# Patient Record
Sex: Male | Born: 1935 | Race: White | Hispanic: No | Marital: Married | Smoking: Former smoker
Health system: Southern US, Community
[De-identification: ages and names within clinical notes are randomized; demographics above are authoritative.]

## PROBLEM LIST (undated history)

## (undated) DIAGNOSIS — K279 Peptic ulcer, site unspecified, unspecified as acute or chronic, without hemorrhage or perforation: Secondary | ICD-10-CM

## (undated) DIAGNOSIS — G629 Polyneuropathy, unspecified: Secondary | ICD-10-CM

## (undated) DIAGNOSIS — B029 Zoster without complications: Secondary | ICD-10-CM

## (undated) DIAGNOSIS — I10 Essential (primary) hypertension: Secondary | ICD-10-CM

## (undated) DIAGNOSIS — K5790 Diverticulosis of intestine, part unspecified, without perforation or abscess without bleeding: Secondary | ICD-10-CM

## (undated) DIAGNOSIS — E119 Type 2 diabetes mellitus without complications: Secondary | ICD-10-CM

## (undated) DIAGNOSIS — K219 Gastro-esophageal reflux disease without esophagitis: Secondary | ICD-10-CM

## (undated) DIAGNOSIS — E7800 Pure hypercholesterolemia, unspecified: Secondary | ICD-10-CM

## (undated) HISTORY — DX: Diverticulosis of intestine, part unspecified, without perforation or abscess without bleeding: K57.90

## (undated) HISTORY — DX: Type 2 diabetes mellitus without complications: E11.9

## (undated) HISTORY — DX: Peptic ulcer, site unspecified, unspecified as acute or chronic, without hemorrhage or perforation: K27.9

## (undated) HISTORY — DX: Zoster without complications: B02.9

## (undated) HISTORY — DX: Polyneuropathy, unspecified: G62.9

## (undated) HISTORY — DX: Essential (primary) hypertension: I10

## (undated) HISTORY — DX: Gastro-esophageal reflux disease without esophagitis: K21.9

## (undated) HISTORY — DX: Pure hypercholesterolemia, unspecified: E78.00

---

## 2001-03-11 ENCOUNTER — Ambulatory Visit (HOSPITAL_BASED_OUTPATIENT_CLINIC_OR_DEPARTMENT_OTHER): Admission: RE | Admit: 2001-03-11 | Discharge: 2001-03-11 | Payer: Self-pay | Admitting: Geriatric Medicine

## 2002-09-26 ENCOUNTER — Encounter: Admission: RE | Admit: 2002-09-26 | Discharge: 2002-12-25 | Payer: Self-pay | Admitting: Geriatric Medicine

## 2003-12-31 ENCOUNTER — Ambulatory Visit (HOSPITAL_COMMUNITY): Admission: RE | Admit: 2003-12-31 | Discharge: 2003-12-31 | Payer: Self-pay | Admitting: Gastroenterology

## 2004-03-18 ENCOUNTER — Emergency Department (HOSPITAL_COMMUNITY): Admission: EM | Admit: 2004-03-18 | Discharge: 2004-03-18 | Payer: Self-pay | Admitting: Emergency Medicine

## 2011-11-11 DIAGNOSIS — E1149 Type 2 diabetes mellitus with other diabetic neurological complication: Secondary | ICD-10-CM | POA: Diagnosis not present

## 2011-11-11 DIAGNOSIS — E1142 Type 2 diabetes mellitus with diabetic polyneuropathy: Secondary | ICD-10-CM | POA: Diagnosis not present

## 2011-11-11 DIAGNOSIS — E7800 Pure hypercholesterolemia, unspecified: Secondary | ICD-10-CM | POA: Diagnosis not present

## 2011-11-11 DIAGNOSIS — I10 Essential (primary) hypertension: Secondary | ICD-10-CM | POA: Diagnosis not present

## 2011-11-11 DIAGNOSIS — Z79899 Other long term (current) drug therapy: Secondary | ICD-10-CM | POA: Diagnosis not present

## 2012-01-20 DIAGNOSIS — Z961 Presence of intraocular lens: Secondary | ICD-10-CM | POA: Diagnosis not present

## 2012-01-20 DIAGNOSIS — D3130 Benign neoplasm of unspecified choroid: Secondary | ICD-10-CM | POA: Diagnosis not present

## 2012-01-20 DIAGNOSIS — E119 Type 2 diabetes mellitus without complications: Secondary | ICD-10-CM | POA: Diagnosis not present

## 2012-01-20 DIAGNOSIS — H04129 Dry eye syndrome of unspecified lacrimal gland: Secondary | ICD-10-CM | POA: Diagnosis not present

## 2012-01-20 DIAGNOSIS — B029 Zoster without complications: Secondary | ICD-10-CM | POA: Diagnosis not present

## 2012-02-01 DIAGNOSIS — D3130 Benign neoplasm of unspecified choroid: Secondary | ICD-10-CM | POA: Diagnosis not present

## 2012-02-01 DIAGNOSIS — B0239 Other herpes zoster eye disease: Secondary | ICD-10-CM | POA: Diagnosis not present

## 2012-02-01 DIAGNOSIS — E119 Type 2 diabetes mellitus without complications: Secondary | ICD-10-CM | POA: Diagnosis not present

## 2012-02-01 DIAGNOSIS — H04129 Dry eye syndrome of unspecified lacrimal gland: Secondary | ICD-10-CM | POA: Diagnosis not present

## 2012-02-06 DIAGNOSIS — I499 Cardiac arrhythmia, unspecified: Secondary | ICD-10-CM | POA: Diagnosis not present

## 2012-02-06 DIAGNOSIS — B0229 Other postherpetic nervous system involvement: Secondary | ICD-10-CM | POA: Diagnosis not present

## 2012-02-08 DIAGNOSIS — B0239 Other herpes zoster eye disease: Secondary | ICD-10-CM | POA: Diagnosis not present

## 2012-02-14 DIAGNOSIS — B0229 Other postherpetic nervous system involvement: Secondary | ICD-10-CM | POA: Diagnosis not present

## 2012-02-14 DIAGNOSIS — R634 Abnormal weight loss: Secondary | ICD-10-CM | POA: Diagnosis not present

## 2012-02-17 DIAGNOSIS — B0233 Zoster keratitis: Secondary | ICD-10-CM | POA: Diagnosis not present

## 2012-03-01 DIAGNOSIS — B0229 Other postherpetic nervous system involvement: Secondary | ICD-10-CM | POA: Diagnosis not present

## 2012-03-12 DIAGNOSIS — B0229 Other postherpetic nervous system involvement: Secondary | ICD-10-CM | POA: Diagnosis not present

## 2012-04-04 DIAGNOSIS — B0222 Postherpetic trigeminal neuralgia: Secondary | ICD-10-CM | POA: Insufficient documentation

## 2012-05-25 DIAGNOSIS — E1149 Type 2 diabetes mellitus with other diabetic neurological complication: Secondary | ICD-10-CM | POA: Diagnosis not present

## 2012-05-25 DIAGNOSIS — Z0000 Encounter for general adult medical examination without abnormal findings: Secondary | ICD-10-CM | POA: Diagnosis not present

## 2012-05-25 DIAGNOSIS — E1142 Type 2 diabetes mellitus with diabetic polyneuropathy: Secondary | ICD-10-CM | POA: Diagnosis not present

## 2012-05-25 DIAGNOSIS — Z79899 Other long term (current) drug therapy: Secondary | ICD-10-CM | POA: Diagnosis not present

## 2012-05-25 DIAGNOSIS — E7800 Pure hypercholesterolemia, unspecified: Secondary | ICD-10-CM | POA: Diagnosis not present

## 2012-05-25 DIAGNOSIS — Z23 Encounter for immunization: Secondary | ICD-10-CM | POA: Diagnosis not present

## 2012-05-25 DIAGNOSIS — Z1331 Encounter for screening for depression: Secondary | ICD-10-CM | POA: Diagnosis not present

## 2012-06-13 DIAGNOSIS — E119 Type 2 diabetes mellitus without complications: Secondary | ICD-10-CM | POA: Diagnosis not present

## 2012-06-13 DIAGNOSIS — B0233 Zoster keratitis: Secondary | ICD-10-CM | POA: Diagnosis not present

## 2012-06-13 DIAGNOSIS — H52209 Unspecified astigmatism, unspecified eye: Secondary | ICD-10-CM | POA: Diagnosis not present

## 2012-06-13 DIAGNOSIS — D3130 Benign neoplasm of unspecified choroid: Secondary | ICD-10-CM | POA: Diagnosis not present

## 2012-07-04 DIAGNOSIS — Z961 Presence of intraocular lens: Secondary | ICD-10-CM | POA: Diagnosis not present

## 2012-07-04 DIAGNOSIS — H04129 Dry eye syndrome of unspecified lacrimal gland: Secondary | ICD-10-CM | POA: Diagnosis not present

## 2012-07-04 DIAGNOSIS — D3130 Benign neoplasm of unspecified choroid: Secondary | ICD-10-CM | POA: Diagnosis not present

## 2012-10-10 DIAGNOSIS — H6120 Impacted cerumen, unspecified ear: Secondary | ICD-10-CM | POA: Diagnosis not present

## 2012-11-23 DIAGNOSIS — I10 Essential (primary) hypertension: Secondary | ICD-10-CM | POA: Diagnosis not present

## 2012-11-23 DIAGNOSIS — E7800 Pure hypercholesterolemia, unspecified: Secondary | ICD-10-CM | POA: Diagnosis not present

## 2012-11-23 DIAGNOSIS — Z79899 Other long term (current) drug therapy: Secondary | ICD-10-CM | POA: Diagnosis not present

## 2012-11-23 DIAGNOSIS — R252 Cramp and spasm: Secondary | ICD-10-CM | POA: Diagnosis not present

## 2012-11-23 DIAGNOSIS — E119 Type 2 diabetes mellitus without complications: Secondary | ICD-10-CM | POA: Diagnosis not present

## 2013-04-04 DIAGNOSIS — Z961 Presence of intraocular lens: Secondary | ICD-10-CM | POA: Diagnosis not present

## 2013-04-04 DIAGNOSIS — B0230 Zoster ocular disease, unspecified: Secondary | ICD-10-CM | POA: Diagnosis not present

## 2013-04-04 DIAGNOSIS — H04129 Dry eye syndrome of unspecified lacrimal gland: Secondary | ICD-10-CM | POA: Diagnosis not present

## 2013-04-04 DIAGNOSIS — H01029 Squamous blepharitis unspecified eye, unspecified eyelid: Secondary | ICD-10-CM | POA: Diagnosis not present

## 2013-04-04 DIAGNOSIS — H1045 Other chronic allergic conjunctivitis: Secondary | ICD-10-CM | POA: Diagnosis not present

## 2013-04-05 DIAGNOSIS — H04129 Dry eye syndrome of unspecified lacrimal gland: Secondary | ICD-10-CM | POA: Diagnosis not present

## 2013-04-05 DIAGNOSIS — D3130 Benign neoplasm of unspecified choroid: Secondary | ICD-10-CM | POA: Diagnosis not present

## 2013-04-05 DIAGNOSIS — Z961 Presence of intraocular lens: Secondary | ICD-10-CM | POA: Diagnosis not present

## 2013-05-02 DIAGNOSIS — Z23 Encounter for immunization: Secondary | ICD-10-CM | POA: Diagnosis not present

## 2013-05-29 DIAGNOSIS — I10 Essential (primary) hypertension: Secondary | ICD-10-CM | POA: Diagnosis not present

## 2013-05-29 DIAGNOSIS — M810 Age-related osteoporosis without current pathological fracture: Secondary | ICD-10-CM | POA: Diagnosis not present

## 2013-05-29 DIAGNOSIS — E1149 Type 2 diabetes mellitus with other diabetic neurological complication: Secondary | ICD-10-CM | POA: Diagnosis not present

## 2013-05-29 DIAGNOSIS — Z79899 Other long term (current) drug therapy: Secondary | ICD-10-CM | POA: Diagnosis not present

## 2013-05-29 DIAGNOSIS — E1142 Type 2 diabetes mellitus with diabetic polyneuropathy: Secondary | ICD-10-CM | POA: Diagnosis not present

## 2013-05-29 DIAGNOSIS — Z0000 Encounter for general adult medical examination without abnormal findings: Secondary | ICD-10-CM | POA: Diagnosis not present

## 2013-06-17 DIAGNOSIS — J329 Chronic sinusitis, unspecified: Secondary | ICD-10-CM | POA: Diagnosis not present

## 2013-06-17 DIAGNOSIS — R000 Tachycardia, unspecified: Secondary | ICD-10-CM | POA: Diagnosis not present

## 2013-07-12 DIAGNOSIS — D1801 Hemangioma of skin and subcutaneous tissue: Secondary | ICD-10-CM | POA: Diagnosis not present

## 2013-07-12 DIAGNOSIS — D2320 Other benign neoplasm of skin of unspecified ear and external auricular canal: Secondary | ICD-10-CM | POA: Diagnosis not present

## 2013-07-12 DIAGNOSIS — L821 Other seborrheic keratosis: Secondary | ICD-10-CM | POA: Diagnosis not present

## 2013-07-12 DIAGNOSIS — D235 Other benign neoplasm of skin of trunk: Secondary | ICD-10-CM | POA: Diagnosis not present

## 2013-07-12 DIAGNOSIS — D239 Other benign neoplasm of skin, unspecified: Secondary | ICD-10-CM | POA: Diagnosis not present

## 2013-07-12 DIAGNOSIS — L570 Actinic keratosis: Secondary | ICD-10-CM | POA: Diagnosis not present

## 2013-08-26 DIAGNOSIS — J40 Bronchitis, not specified as acute or chronic: Secondary | ICD-10-CM | POA: Diagnosis not present

## 2013-11-20 DIAGNOSIS — I10 Essential (primary) hypertension: Secondary | ICD-10-CM | POA: Diagnosis not present

## 2013-11-20 DIAGNOSIS — E1142 Type 2 diabetes mellitus with diabetic polyneuropathy: Secondary | ICD-10-CM | POA: Diagnosis not present

## 2013-11-20 DIAGNOSIS — Z23 Encounter for immunization: Secondary | ICD-10-CM | POA: Diagnosis not present

## 2013-11-20 DIAGNOSIS — B0229 Other postherpetic nervous system involvement: Secondary | ICD-10-CM | POA: Diagnosis not present

## 2013-11-20 DIAGNOSIS — E7800 Pure hypercholesterolemia, unspecified: Secondary | ICD-10-CM | POA: Diagnosis not present

## 2013-11-20 DIAGNOSIS — Z79899 Other long term (current) drug therapy: Secondary | ICD-10-CM | POA: Diagnosis not present

## 2013-11-20 DIAGNOSIS — E1149 Type 2 diabetes mellitus with other diabetic neurological complication: Secondary | ICD-10-CM | POA: Diagnosis not present

## 2014-01-07 DIAGNOSIS — J309 Allergic rhinitis, unspecified: Secondary | ICD-10-CM | POA: Diagnosis not present

## 2014-01-07 DIAGNOSIS — K219 Gastro-esophageal reflux disease without esophagitis: Secondary | ICD-10-CM | POA: Diagnosis not present

## 2014-01-07 DIAGNOSIS — J410 Simple chronic bronchitis: Secondary | ICD-10-CM | POA: Diagnosis not present

## 2014-04-30 DIAGNOSIS — H04129 Dry eye syndrome of unspecified lacrimal gland: Secondary | ICD-10-CM | POA: Diagnosis not present

## 2014-04-30 DIAGNOSIS — D3130 Benign neoplasm of unspecified choroid: Secondary | ICD-10-CM | POA: Diagnosis not present

## 2014-04-30 DIAGNOSIS — Z961 Presence of intraocular lens: Secondary | ICD-10-CM | POA: Diagnosis not present

## 2014-04-30 DIAGNOSIS — E119 Type 2 diabetes mellitus without complications: Secondary | ICD-10-CM | POA: Diagnosis not present

## 2014-04-30 DIAGNOSIS — B0230 Zoster ocular disease, unspecified: Secondary | ICD-10-CM | POA: Diagnosis not present

## 2014-05-28 DIAGNOSIS — Z23 Encounter for immunization: Secondary | ICD-10-CM | POA: Diagnosis not present

## 2014-06-11 DIAGNOSIS — Z0000 Encounter for general adult medical examination without abnormal findings: Secondary | ICD-10-CM | POA: Diagnosis not present

## 2014-06-11 DIAGNOSIS — Z1389 Encounter for screening for other disorder: Secondary | ICD-10-CM | POA: Diagnosis not present

## 2014-06-11 DIAGNOSIS — I10 Essential (primary) hypertension: Secondary | ICD-10-CM | POA: Diagnosis not present

## 2014-06-11 DIAGNOSIS — Z79899 Other long term (current) drug therapy: Secondary | ICD-10-CM | POA: Diagnosis not present

## 2014-06-11 DIAGNOSIS — K219 Gastro-esophageal reflux disease without esophagitis: Secondary | ICD-10-CM | POA: Diagnosis not present

## 2014-06-11 DIAGNOSIS — E1140 Type 2 diabetes mellitus with diabetic neuropathy, unspecified: Secondary | ICD-10-CM | POA: Diagnosis not present

## 2014-06-11 DIAGNOSIS — E780 Pure hypercholesterolemia: Secondary | ICD-10-CM | POA: Diagnosis not present

## 2014-07-11 ENCOUNTER — Encounter (INDEPENDENT_AMBULATORY_CARE_PROVIDER_SITE_OTHER): Payer: Self-pay

## 2014-07-11 ENCOUNTER — Ambulatory Visit
Admission: RE | Admit: 2014-07-11 | Discharge: 2014-07-11 | Disposition: A | Discharge status: Home / Self Care | Payer: Medicare Other | Source: Ambulatory Visit | Attending: Geriatric Medicine | Admitting: Geriatric Medicine

## 2014-07-11 ENCOUNTER — Other Ambulatory Visit: Payer: Self-pay | Admitting: Geriatric Medicine

## 2014-07-11 DIAGNOSIS — R05 Cough: Secondary | ICD-10-CM | POA: Diagnosis not present

## 2014-07-11 DIAGNOSIS — R2681 Unsteadiness on feet: Secondary | ICD-10-CM | POA: Diagnosis not present

## 2014-07-11 DIAGNOSIS — R059 Cough, unspecified: Secondary | ICD-10-CM

## 2014-07-11 DIAGNOSIS — J309 Allergic rhinitis, unspecified: Secondary | ICD-10-CM | POA: Diagnosis not present

## 2014-07-11 DIAGNOSIS — R0989 Other specified symptoms and signs involving the circulatory and respiratory systems: Secondary | ICD-10-CM | POA: Diagnosis not present

## 2014-07-11 DIAGNOSIS — I517 Cardiomegaly: Secondary | ICD-10-CM | POA: Diagnosis not present

## 2014-08-28 ENCOUNTER — Ambulatory Visit (INDEPENDENT_AMBULATORY_CARE_PROVIDER_SITE_OTHER): Payer: Medicare Other | Admitting: Internal Medicine

## 2014-08-28 ENCOUNTER — Encounter: Payer: Self-pay | Admitting: Internal Medicine

## 2014-08-28 VITALS — BP 122/60 | HR 87 | Ht 71.0 in | Wt 163.0 lb

## 2014-08-28 DIAGNOSIS — J310 Chronic rhinitis: Secondary | ICD-10-CM | POA: Insufficient documentation

## 2014-08-28 DIAGNOSIS — J8410 Pulmonary fibrosis, unspecified: Secondary | ICD-10-CM | POA: Insufficient documentation

## 2014-08-28 DIAGNOSIS — J84112 Idiopathic pulmonary fibrosis: Secondary | ICD-10-CM

## 2014-08-28 DIAGNOSIS — J849 Interstitial pulmonary disease, unspecified: Secondary | ICD-10-CM

## 2014-08-28 MED ORDER — AZELASTINE-FLUTICASONE 137-50 MCG/ACT NA SUSP: 2.0000 | NASAL | Status: DC

## 2014-08-28 NOTE — Patient Instructions (Signed)
Order- schedule CT chest  noncontrast    Dx interstitial lung disease  Order -schedule PFT and 6 MWT  Sample Dymista nasal spray   1-2 puffs each nostril once daily at bedtime  Please call as needed

## 2014-08-28 NOTE — Progress Notes (Signed)
08/28/14-  81 yo M former smoker referred courtesy of Dr Felipa Eth for interstitial lung disease CXR 12/11/115- images reviewed with him IMPRESSION: Borderline cardiomegaly. Peripheral and bilateral basilar reticular interstitial prominence suspicious for chronic interstitial lung disease or fibrotic changes. No definite superimposed infiltrate or pulmonary edema. Electronically Signed  By: Lahoma Crocker M.D.  On: 07/11/2014 12:00 In the 1990s a chest x-ray showed "scar tissue" attributed to a bad pneumonia he had in the [employer] in the 1940s. Now, for the last 2 months, he has noticed dry cough with superimposed chest cold caught from his wife. Yellow sputum cleared. Still has morning cough productive of clear sputum and watery rhinorrhea. Not really short of breath but easier dyspnea on exertion and doesn't feel he can take as deep a breath as normal. In just the last few days he has noticed some wheeze. History of reflux symptoms after eating treated with Tums tablets. This may wake him occasionally but has not seemed severe. Easy choking while eating. Smoked a pipe in the mid 1980s. 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  Prior to Admission medications   Medication Sig Start Date End Date Taking? Authorizing Provider  benazepril (LOTENSIN) 10 MG tablet Take 10 mg by mouth daily.   Yes Historical Provider, MD  calcium citrate-vitamin D (CITRACAL+D) 315-200 MG-UNIT per tablet Take 1 tablet by mouth 2 (two) times daily.   Yes Historical Provider, MD  glipiZIDE (GLUCOTROL) 5 MG tablet Take 2 tablets in AM and 1 tablet in PM   Yes Historical Provider, MD  Glucosamine-Chondroit-Vit C-Mn (GLUCOSAMINE CHONDR 1500 COMPLX PO) Take by mouth.   Yes Historical Provider, MD  ipratropium (ATROVENT) 0.03 % nasal spray Place 2 sprays into both nostrils every 12 (twelve) hours.   Yes Historical Provider, MD  metFORMIN (GLUCOPHAGE) 500 MG tablet Take 1,000 mg by mouth daily  with breakfast.   Yes Historical Provider, MD  Multiple Vitamin (MULTIVITAMIN) tablet Take 1 tablet by mouth daily.   Yes Historical Provider, MD  omeprazole (PRILOSEC) 20 MG capsule Take 20 mg by mouth daily.   Yes Historical Provider, MD  simvastatin (ZOCOR) 40 MG tablet Take 40 mg by mouth daily.   Yes Historical Provider, MD  Azelastine-Fluticasone (DYMISTA) 137-50 MCG/ACT SUSP Place 2 sprays into the nose 1 day or 1 dose. 08/28/14   Deneise Lever, MD   Past Medical History  Diagnosis Date  . DM (diabetes mellitus)   . Peptic ulcer   . GERD (gastroesophageal reflux disease)   . Hypertension   . Hypercholesterolemia   . Peripheral neuropathy   . Diverticulosis   . Shingles    No past surgical history on file. Family History  Problem Relation Age of Onset  . Heart disease Brother   . Heart disease Brother   . Rheum arthritis Mother   . Rheum arthritis Father    History   Social History  . Marital Status: Married    Spouse Name: N/A    Number of Children: N/A  . Years of Education: N/A   Occupational History  . clergyman    Social History Main Topics  . Smoking status: Former Smoker    Types: Pipe    Quit date: 08/02/1983  . Smokeless tobacco: Not on file     Comment: smoked pipe for around 50 years  . Alcohol Use: 0.0 oz/week    0 Not specified per week     Comment: occasionally  . Drug Use: No  . Sexual  Activity: Not on file   Other Topics Concern  . Not on file   Social History Narrative  . No narrative on file   ROS-see HPI Constitutional:   No-   weight loss, night sweats, fevers, chills, fatigue, lassitude. HEENT:   No-  headaches, +difficulty swallowing, tooth/dental problems, sore throat,       No-  sneezing, itching, ear ache, nasal congestion, +post nasal drip,  CV:  No-   chest pain, orthopnea, PND, swelling in lower extremities, anasarca,                                  dizziness, palpitations Resp: + shortness of breath with exertion or at  rest.              + productive cough,  No non-productive cough,  No- coughing up of blood.              No-   change in color of mucus. + wheezing.   Skin: No-   rash or lesions. GI:  +heartburn, indigestion, no-abdominal pain, nausea, vomiting, diarrhea,                 change in bowel habits, loss of appetite GU: No-   dysuria, change in color of urine, no urgency or frequency.  No- flank pain. MS:  No-   joint pain or swelling.  No- decreased range of motion.  No- back pain. Neuro-     nothing unusual Psych:  No- change in mood or affect. No depression or anxiety.  No memory loss.  OBJ- Physical Exam    Tall, slender, healthy appearing man General- Alert, Oriented, Affect-appropriate, Distress- none acute Skin- rash-none, lesions- none, excoriation- none Lymphadenopathy- none Head- atraumatic            Eyes- Gross vision intact, PERRLA, conjunctivae and secretions clear            Ears- Hearing, canals-normal            Nose- Clear, no-Septal dev, mucus, polyps, erosion, perforation             Throat- Mallampati II , mucosa clear , drainage- none, tonsils- atrophic Neck- flexible , trachea midline, no stridor , thyroid nl, carotid no bruit Chest - symmetrical excursion , unlabored           Heart/CV- RRR , no murmur , no gallop  , no rub, nl s1 s2                           - JVD- none , edema- none, stasis changes- none, varices- none           Lung- + basilar crackles right greater than left, wheeze- none, cough- none , dullness-none, rub- none           Chest wall-  Abd- tender-no, distended-no, bowel sounds-present, HSM- no Br/ Gen/ Rectal- Not done, not indicated Extrem- cyanosis- none, clubbing- none, atrophy- none, strength- nl Neuro- grossly intact to observation

## 2014-08-28 NOTE — Assessment & Plan Note (Signed)
Nasal congestion and bothersome active drainage associated with nonspecific irritants, temperature changes and food. This would be consistent with a vasomotor rhinitis which is also more likely at his age. He had previously tried ipratropium nasal spray but he says it didn't work for very long. Plan-try sample Dymista

## 2014-08-28 NOTE — Assessment & Plan Note (Signed)
Specific pattern is unclear. Chest x-rays are not particularly sharp and we will need a chest x-ray for better definition. I can't tell how progressive this process has been. We know he had at least some interstitial disease many years ago, attributed to a pneumonia. He has been at least somewhat at risk for aspiration but has not had significant occupational exposures and does not have substantial arthritis or other clues to suggest a significant connective tissue disorder. This can be reconsidered after we see the CT scan. Plan-CT chest, schedule formal pulmonary function tests

## 2014-09-05 ENCOUNTER — Ambulatory Visit (INDEPENDENT_AMBULATORY_CARE_PROVIDER_SITE_OTHER)
Admission: RE | Admit: 2014-09-05 | Discharge: 2014-09-05 | Disposition: A | Discharge status: Home / Self Care | Payer: Medicare Other | Source: Ambulatory Visit | Attending: Internal Medicine | Admitting: Internal Medicine

## 2014-09-05 DIAGNOSIS — I2510 Atherosclerotic heart disease of native coronary artery without angina pectoris: Secondary | ICD-10-CM | POA: Diagnosis not present

## 2014-09-05 DIAGNOSIS — J479 Bronchiectasis, uncomplicated: Secondary | ICD-10-CM | POA: Diagnosis not present

## 2014-09-05 DIAGNOSIS — J84112 Idiopathic pulmonary fibrosis: Secondary | ICD-10-CM

## 2014-09-22 ENCOUNTER — Ambulatory Visit (INDEPENDENT_AMBULATORY_CARE_PROVIDER_SITE_OTHER): Payer: Medicare Other | Admitting: Internal Medicine

## 2014-09-22 DIAGNOSIS — J8410 Pulmonary fibrosis, unspecified: Secondary | ICD-10-CM | POA: Diagnosis not present

## 2014-09-22 DIAGNOSIS — J849 Interstitial pulmonary disease, unspecified: Secondary | ICD-10-CM

## 2014-09-22 LAB — PULMONARY FUNCTION TEST
DL/VA % pred: 88 %
DL/VA: 4.04 ml/min/mmHg/L
DLCO UNC % PRED: 33 %
DLCO unc: 11 ml/min/mmHg
FEF 25-75 Post: 3.41 L/sec
FEF 25-75 Pre: 3.31 L/sec
FEF2575-%CHANGE-POST: 3 %
FEF2575-%Pred-Post: 237 %
FEF2575-%Pred-Pre: 229 %
FEV1-%Change-Post: 3 %
FEV1-%PRED-POST: 66 %
FEV1-%Pred-Pre: 64 %
FEV1-POST: 1.62 L
FEV1-Pre: 1.57 L
FEV1FVC-%CHANGE-POST: 1 %
FEV1FVC-%Pred-Pre: 128 %
FEV6-%Change-Post: -6 %
FEV6-%PRED-POST: 49 %
FEV6-%Pred-Pre: 53 %
FEV6-Post: 1.65 L
FEV6-Pre: 1.75 L
FEV6FVC-%CHANGE-POST: -4 %
FEV6FVC-%PRED-POST: 103 %
FEV6FVC-%PRED-PRE: 107 %
FVC-%CHANGE-POST: 1 %
FVC-%PRED-POST: 49 %
FVC-%PRED-PRE: 49 %
FVC-Post: 1.79 L
FVC-Pre: 1.76 L
POST FEV6/FVC RATIO: 95 %
PRE FEV1/FVC RATIO: 89 %
PRE FEV6/FVC RATIO: 99 %
Post FEV1/FVC ratio: 91 %
RV % PRED: 33 %
RV: 0.99 L
TLC % PRED: 37 %
TLC: 2.73 L

## 2014-09-22 NOTE — Progress Notes (Signed)
PFT done today. 

## 2014-09-22 NOTE — Progress Notes (Signed)
08/28/14-  81 yo M former smoker referred courtesy of Dr Felipa Eth for interstitial lung disease CXR 12/11/115- images reviewed with him IMPRESSION: Borderline cardiomegaly. Peripheral and bilateral basilar reticular interstitial prominence suspicious for chronic interstitial lung disease or fibrotic changes. No definite superimposed infiltrate or pulmonary edema. Electronically Signed  By: Lahoma Crocker M.D.  On: 07/11/2014 12:00 In the 1990s a chest x-ray showed "scar tissue" attributed to a bad pneumonia he had in the [employer] in the 1940s. Now, for the last 2 months, he has noticed dry cough with superimposed chest cold caught from his wife. Yellow sputum cleared. Still has morning cough productive of clear sputum and watery rhinorrhea. Not really short of breath but easier dyspnea on exertion and doesn't feel he can take as deep a breath as normal. In just the last few days he has noticed some wheeze. History of reflux symptoms after eating treated with Tums tablets. This may wake him occasionally but has not seemed severe. Easy choking while eating. Smoked a pipe in the mid 1980s. 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  09/22/14-documentation for 6 minute walk test 92%, 88%, 93%, 555 feet. Oxygen desaturation during this exercise with good recovery upon resting.  ROS-see HPI Constitutional:   No-   weight loss, night sweats, fevers, chills, fatigue, lassitude. HEENT:   No-  headaches, +difficulty swallowing, tooth/dental problems, sore throat,       No-  sneezing, itching, ear ache, nasal congestion, +post nasal drip,  CV:  No-   chest pain, orthopnea, PND, swelling in lower extremities, anasarca,                                  dizziness, palpitations Resp: + shortness of breath with exertion or at rest.              + productive cough,  No non-productive cough,  No- coughing up of blood.              No-   change in color of mucus. + wheezing.   Skin:  No-   rash or lesions. GI:  +heartburn, indigestion, no-abdominal pain, nausea, vomiting, diarrhea,                 change in bowel habits, loss of appetite GU: No-   dysuria, change in color of urine, no urgency or frequency.  No- flank pain. MS:  No-   joint pain or swelling.  No- decreased range of motion.  No- back pain. Neuro-     nothing unusual Psych:  No- change in mood or affect. No depression or anxiety.  No memory loss.  OBJ- Physical Exam    Tall, slender, healthy appearing man General- Alert, Oriented, Affect-appropriate, Distress- none acute Skin- rash-none, lesions- none, excoriation- none Lymphadenopathy- none Head- atraumatic            Eyes- Gross vision intact, PERRLA, conjunctivae and secretions clear            Ears- Hearing, canals-normal            Nose- Clear, no-Septal dev, mucus, polyps, erosion, perforation             Throat- Mallampati II , mucosa clear , drainage- none, tonsils- atrophic Neck- flexible , trachea midline, no stridor , thyroid nl, carotid no bruit Chest - symmetrical excursion , unlabored  Heart/CV- RRR , no murmur , no gallop  , no rub, nl s1 s2                           - JVD- none , edema- none, stasis changes- none, varices- none           Lung- + basilar crackles right greater than left, wheeze- none, cough- none , dullness-none, rub- none           Chest wall-  Abd- tender-no, distended-no, bowel sounds-present, HSM- no Br/ Gen/ Rectal- Not done, not indicated Extrem- cyanosis- none, clubbing- none, atrophy- none, strength- nl Neuro- grossly intact to observation

## 2014-10-10 ENCOUNTER — Encounter: Payer: Self-pay | Admitting: Internal Medicine

## 2014-10-10 ENCOUNTER — Ambulatory Visit (INDEPENDENT_AMBULATORY_CARE_PROVIDER_SITE_OTHER): Payer: Medicare Other | Admitting: Internal Medicine

## 2014-10-10 VITALS — BP 118/64 | HR 88 | Ht 71.0 in | Wt 162.2 lb

## 2014-10-10 DIAGNOSIS — J8410 Pulmonary fibrosis, unspecified: Secondary | ICD-10-CM

## 2014-10-10 DIAGNOSIS — J310 Chronic rhinitis: Secondary | ICD-10-CM

## 2014-10-10 MED ORDER — IPRATROPIUM BROMIDE 0.06 % NA SOLN: NASAL | Status: DC

## 2014-10-10 NOTE — Assessment & Plan Note (Signed)
He did not like Dymista which seemed irritating. He had previously failed to respond to ipratropium 0.03%. He says watery rhinorrhea, especially at mealtime, is his most important complaint. Plan-try ipratropium 0.06% nasal spray

## 2014-10-10 NOTE — Patient Instructions (Addendum)
Patient information on OFEV to treat the kind of pulmonary fibrosis you have, called  UIP or Usual Interstitial Pneumonia  Script for ipratropium 0.06 % nasal spray to try for the runny nose- sent   Order- DME [place] room air  Dx pulmonary fibrosis

## 2014-10-10 NOTE — Assessment & Plan Note (Signed)
This is UIP. I don't get a convincing history suggesting applications of reflux with aspiration. At age 81 he may be better with no treatment. We did review available medications OFEV and Esbriet which may slow progression, with a fairly high incidence of GI discomfort. Patient information given) that he and his [facility] Nurse can review and consider if he wants to try it. His expressed goal is to live long enough to continue providing support for his wife as long as he can. I told him life expectancy with this disease is usually quoted at 4 or 5 years, potentially somewhat longer with one of the new medications. Plan-patient information on OFEV, ONOX on room air so we can assess candidacy for home O2. Walk as tolerated to maintain endurance.

## 2014-10-10 NOTE — Progress Notes (Signed)
08/28/14-  81 yo M former smoker referred courtesy of Dr Felipa Eth for interstitial lung disease CXR 12/11/115- images reviewed with him IMPRESSION: Borderline cardiomegaly. Peripheral and bilateral basilar reticular interstitial prominence suspicious for chronic interstitial lung disease or fibrotic changes. No definite superimposed infiltrate or pulmonary edema. Electronically Signed  By: Lahoma Crocker M.D.  On: 07/11/2014 12:00 In the 1990s a chest x-ray showed "scar tissue" attributed to a bad pneumonia he had in [place] [employer] in the 1940[place]. Now, for the last 2 months, he has noticed dry cough with superimposed chest cold caught from his wife. Yellow sputum cleared. Still has morning cough productive of clear sputum and watery rhinorrhea. Not really short of breath but easier dyspnea on exertion and doesn't feel he can take as deep a breath as normal. In just the last few days he has noticed some wheeze. History of reflux symptoms after eating treated with Tums tablets. This may wake him occasionally but has not seemed severe. Easy choking while eating. Smoked a pipe in the mid 1980s. 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  09/22/14-documentation for 6 minute walk test 92%, 88%, 93%, 555 feet. Oxygen desaturation during this exercise with good recovery upon resting.  10/10/14-  81 yo M former smoker referred courtesy of Dr Felipa Eth for interstitial lung disease/ UIP FOLLOWS FOR: review PFT and 6MW test with patient. Here today with Mrs. Earney Hamburg nurse He describes having gotten over a cold but does not distinguish well. He apparently has at least morning cough most days with some clear phlegm or dry cough. He denies waking choking or aware of reflux. Bothersome rhinorrhea he says is actually his main complaint. Aware of dyspnea on exertion climbing stairs. 6 MWT-6 minute walk test 92%, 88%, 93%, 555 feet. Oxygen desaturation during this exercise with good  recovery upon resting. PFT 09/22/14- severe restriction, TLC 37%, DLCO33% CT chest HiDef IMPRESSION: 1. The appearance of the lungs is compatible with interstitial lung disease, and the pattern is considered diagnostic of usual interstitial pneumonia (UIP) from an imaging standpoint. 2. Atherosclerosis, including 3 vessel coronary artery disease. 3. Additional incidental findings, as above. Electronically Signed  By: Vinnie Langton M.D.  On: 09/05/2014 14:24  ROS-see HPI Constitutional:   No-   weight loss, night sweats, fevers, chills, fatigue, lassitude. HEENT:   No-  headaches, +difficulty swallowing, tooth/dental problems, sore throat,       No-  sneezing, itching, ear ache, nasal congestion, +post nasal drip,  CV:  No-   chest pain, orthopnea, PND, swelling in lower extremities, anasarca,                                  dizziness, palpitations Resp: + shortness of breath with exertion or at rest.              + productive cough,  +No non-productive cough,  No- coughing up of blood.              No-   change in color of mucus.  wheezing.   Skin: No-   rash or lesions. GI:  +heartburn, indigestion, no-abdominal pain, nausea, vomiting,  GU: . MS:  No-   joint pain or swelling.  . Neuro-     nothing unusual Psych:  No- change in mood or affect. No depression or anxiety.  No memory loss.  OBJ- Physical Exam    Tall, slender, healthy appearing man  General- Alert, Oriented, Affect-appropriate, Distress- none acute Skin- rash-none, lesions- none, excoriation- none Lymphadenopathy- none Head- atraumatic            Eyes- Gross vision intact, PERRLA, conjunctivae and secretions clear            Ears- Hearing, canals-normal            Nose- Clear, no-Septal dev, mucus, polyps, erosion, perforation             Throat- Mallampati II , mucosa clear , drainage- none, tonsils- atrophic Neck- flexible , trachea midline, no stridor , thyroid nl, carotid no bruit Chest - symmetrical  excursion , unlabored           Heart/CV- RRR , no murmur , no gallop  , no rub, nl s1 s2                           - JVD- none , edema- none, stasis changes- none, varices- none           Lung- +  crackles right greater than left, wheeze- none, cough+ raspy , dullness-none, rub- none           Chest wall-  Abd-  Br/ Gen/ Rectal- Not done, not indicated Extrem- cyanosis- none, clubbing- none, atrophy- none, strength- nl Neuro- grossly intact to observation

## 2014-10-17 ENCOUNTER — Other Ambulatory Visit: Payer: Self-pay | Admitting: Internal Medicine

## 2014-10-17 ENCOUNTER — Telehealth: Payer: Self-pay | Admitting: Internal Medicine

## 2014-10-17 MED ORDER — NINTEDANIB ESYLATE 150 MG PO CAPS: ORAL_CAPSULE | ORAL | Status: DC

## 2014-10-17 NOTE — Telephone Encounter (Signed)
I reviewed OFEV again with him and answered his questions. He already read the patient materials. He hopes to slow progression so he can care for his wife as long as possible, Plan- order hepatic function panel and BMET          Joellen Jersey will work with him to get paperwork started          Script written for OFEV 150 mg twice daily with food, # 30, ref x 2         Anticipate script for loperamide 2 mg, # 30, 1 after each loose stool, ref x 2

## 2014-10-17 NOTE — Telephone Encounter (Signed)
Pt states that an ONO was ordered 10/10/14  Pt states that he has not heard anything from [facility] [place] about scheduling appt for ONO.   Called [phone] and LM with answering service to call back

## 2014-10-17 NOTE — Telephone Encounter (Signed)
Ellise([facility]) calling back for Samuel Paul please return call to [phone]

## 2014-10-17 NOTE — Telephone Encounter (Signed)
Expand All Collapse All   Ellise([facility]) calling back for Ivet Guerrieri please return call to [phone]   Spoke with Donaciano Eva with [facility] - states the RT does have the ONO order and plans to call pt Monday to schedule appt.  ---- Spoke with pt, aware that [facility] will be calling him on Monday to schedule  Nothing further needed.

## 2014-10-17 NOTE — Telephone Encounter (Signed)
Pt would like to discuss with Dr Annamaria Boots about starting OFEV (when to start), financial info, effectiveness, benefit to taking and side effects.  Pt states that he has been reading through the packet and is wanting to discuss a few things.  Pt would like to have Dr Annamaria Boots call him to discuss.    Please advise Dr Annamaria Boots. Thanks.

## 2014-10-17 NOTE — Telephone Encounter (Signed)
Disregard message below - placed in wrong telephone note.  Dr Annamaria Boots please advise on the [facility] information below.

## 2014-10-20 NOTE — Telephone Encounter (Signed)
Called to speak with patient-he was not at home at the time of my call; gave the wife our phone number and my name to have the patient call me back this afternoon. We will need him to come to the office to sign papers for OFEV process as well as the rep here today can do a short 30-45 minute class with patient to help him understand OFEV and the whole process. This can be done at 3:00pm today.

## 2014-10-21 DIAGNOSIS — J8410 Pulmonary fibrosis, unspecified: Secondary | ICD-10-CM | POA: Diagnosis not present

## 2014-10-23 ENCOUNTER — Telehealth: Payer: Self-pay | Admitting: Internal Medicine

## 2014-10-23 DIAGNOSIS — J8410 Pulmonary fibrosis, unspecified: Secondary | ICD-10-CM

## 2014-10-23 NOTE — Telephone Encounter (Signed)
ONOX documents that oxygen level is falling significantly at night. He will qualify, so we need to start home oxygen for sleep 2L/min for dx pulmonary fibrosis  [facility].

## 2014-10-23 NOTE — Telephone Encounter (Signed)
Called mandy and LMTCB x1 Called pt and LMTCB x1 Order placed

## 2014-10-23 NOTE — Telephone Encounter (Signed)
Pt is aware of CY's recommendation. Nothing further was needed. 

## 2014-10-23 NOTE — Telephone Encounter (Signed)
ONO was received and placed in CDY's lookat and msg sent marked urgent

## 2014-10-27 ENCOUNTER — Telehealth: Payer: Self-pay | Admitting: Internal Medicine

## 2014-10-27 DIAGNOSIS — J8410 Pulmonary fibrosis, unspecified: Secondary | ICD-10-CM

## 2014-10-27 NOTE — Telephone Encounter (Signed)
Spoke with the pt  He is requesting to come in and have labs to be done in order to get started on OFEV  I have placed order in the computer for this  Nothing further needed

## 2014-10-30 ENCOUNTER — Other Ambulatory Visit (INDEPENDENT_AMBULATORY_CARE_PROVIDER_SITE_OTHER): Payer: Medicare Other

## 2014-10-30 DIAGNOSIS — J8410 Pulmonary fibrosis, unspecified: Secondary | ICD-10-CM | POA: Diagnosis not present

## 2014-10-30 LAB — BASIC METABOLIC PANEL
BUN: 14 mg/dL (ref 6–23)
CHLORIDE: 100 meq/L (ref 96–112)
CO2: 34 meq/L — AB (ref 19–32)
Calcium: 10.2 mg/dL (ref 8.4–10.5)
Creatinine, Ser: 0.97 mg/dL (ref 0.40–1.50)
GFR: 76.98 mL/min (ref >60.00)
GLUCOSE: 145 mg/dL — AB (ref 70–99)
POTASSIUM: 5 meq/L (ref 3.5–5.1)
SODIUM: 137 meq/L (ref 135–145)

## 2014-10-30 LAB — HEPATIC FUNCTION PANEL
ALK PHOS: 83 U/L (ref 39–117)
ALT: 17 U/L (ref 0–53)
AST: 18 U/L (ref 0–37)
Albumin: 3.9 g/dL (ref 3.5–5.2)
BILIRUBIN TOTAL: 0.4 mg/dL (ref 0.2–1.2)
Bilirubin, Direct: 0.1 mg/dL (ref 0.0–0.3)
Total Protein: 7.6 g/dL (ref 6.0–8.3)

## 2014-11-13 ENCOUNTER — Telehealth: Payer: Self-pay | Admitting: Internal Medicine

## 2014-11-13 NOTE — Telephone Encounter (Signed)
Called spoke with [facility] and was advised the referral form they received on pt ofev is not valid. In [place] it requires a dual signature on form and they did not receive this. Form is being faxed to triage. Will await fax

## 2014-11-14 NOTE — Telephone Encounter (Signed)
samantha- [facility]  Checking on status of fax [phone]

## 2014-11-14 NOTE — Telephone Encounter (Signed)
Spoke with Samuel Paul. Advised her that we had not received this form. She is going to speak to [facility] and have this form refaxed. Will hold in triage to follow up on.

## 2014-11-17 NOTE — Telephone Encounter (Signed)
From has been received and filled out. Will be placed on CY's cart for signature. Will route message to [facility] to follow up on.

## 2014-11-18 ENCOUNTER — Telehealth: Payer: Self-pay | Admitting: Internal Medicine

## 2014-11-18 NOTE — Telephone Encounter (Signed)
LMTCB

## 2014-11-19 NOTE — Telephone Encounter (Signed)
Spoke with pt. He is inquiring about the status of Ofev. Advised him that we had to fill out a form on his behalf. This was placed on CY's cart for his signature. The previous message was closed.  Joellen Jersey - was this form faxed??

## 2014-11-19 NOTE — Telephone Encounter (Signed)
Spoke with pt, unable to speak at the moment. States he will call back later

## 2014-11-19 NOTE — Telephone Encounter (Signed)
Pt has called back - [phone]

## 2014-11-19 NOTE — Telephone Encounter (Signed)
All information CY needed to go along with forms were attached to Ofev form received and placed on CY's cart for him to sign and fill out required explanation as to why the patient needs Ofev. Thanks.

## 2014-11-21 ENCOUNTER — Telehealth: Payer: Self-pay | Admitting: *Deleted

## 2014-11-21 NOTE — Telephone Encounter (Signed)
CY is completing this. It is on his desk; I will fax back once ready. Thanks.

## 2014-11-21 NOTE — Telephone Encounter (Signed)
Done

## 2014-11-21 NOTE — Telephone Encounter (Signed)
Has this been taken care of?  Expand All Collapse All   All information CY needed to go along with forms were attached to Ofev form received and placed on CY's cart for him to sign and fill out required explanation as to why the patient needs Ofev. Thanks.

## 2014-11-21 NOTE — Telephone Encounter (Signed)
Received PA request with Key: [id number] from covermymeds for OFEV. Request was submitted today. Please allow up to 5 business days but I asked to expedited response. Member ID: [id number]

## 2014-11-21 NOTE — Telephone Encounter (Signed)
This has been faxed back today.

## 2014-11-24 NOTE — Telephone Encounter (Signed)
Spoke with pt and advised that PA for Ofev was initiated on 11/21/14 and we will let him know when we here back from them.  Pt verbalized understanding.

## 2014-11-24 NOTE — Telephone Encounter (Signed)
Please call to get PA at [phone].  The patient is UPSET this has not been taken care of (he states this has been going on 2 months).  Please CALL PATIENT back at [phone].

## 2014-11-24 NOTE — Telephone Encounter (Signed)
Will send to [facility] to follow up and report on status this week

## 2014-11-24 NOTE — Telephone Encounter (Signed)
Please see phone note dated 11-18-14 for additional information and this way there are not 2 phone notes opened on same patient for same reason of call. Thanks.

## 2014-11-24 NOTE — Telephone Encounter (Signed)
      Call Documentation      Christie Beckers, RN at 11/24/2014 10:23 AM     Status: Signed       Expand All Collapse All   Spoke with pt and advised that PA for Ofev was initiated on 11/21/14 and we will let him know when we here back from them. Pt verbalized understanding.            Otelia Limes Johnson at 11/24/2014 10:10 AM     Status: Signed       Expand All Collapse All   Please call to get PA at [phone]. The patient is UPSET this has not been taken care of (he states this has been going on 2 months). Please CALL PATIENT back at [phone].            Devona Konig, CMA at 11/21/2014 3:13 PM     Status: Signed       Expand All Collapse All   Received PA request with Key: [id number] from covermymeds for OFEV. Request was submitted today. Please allow up to 5 business days but I asked to expedited response. Member ID: [id number]

## 2014-11-25 NOTE — Telephone Encounter (Signed)
Ok to wait on OFEV. I have no other medication in mind, except Esbriet which is very similar. We will discuss at next ov.

## 2014-11-25 NOTE — Telephone Encounter (Signed)
Ivin Booty calling stating that pt doesn't  Want to be on ofev now,and that he wants to try other meds first  Ivin Booty from [facility] can be reached @ [phone].Hillery Hunter

## 2014-11-25 NOTE — Telephone Encounter (Signed)
I have noted this and will forward to CY to advise on Wednesday 11-26-14. Thanks.

## 2014-11-26 ENCOUNTER — Telehealth: Payer: Self-pay | Admitting: Internal Medicine

## 2014-11-26 NOTE — Telephone Encounter (Signed)
Pt is aware of CY's recommendation. Nothing further was needed. 

## 2014-11-26 NOTE — Telephone Encounter (Signed)
Called and spoke to rep at [facility]. PA was for Temazepam. There is nothing in pt's chart showing CY ordered Temazepam. PA removed from pt's account. Nothing further needed.

## 2014-11-27 ENCOUNTER — Telehealth: Payer: Self-pay | Admitting: *Deleted

## 2014-11-27 NOTE — Telephone Encounter (Signed)
Called and spoke to pt. Pt was unsure if he should or should not be on medication. Pt requesting to speak with CY regarding the medication.   CY please advise if pt can be worked into schedule or pt can be contact over the phone.

## 2014-11-27 NOTE — Telephone Encounter (Signed)
OFEV has been approved. Approval # [id number] Valid from 11/21/14-11/21/15 [employer] notified at [phone]. Patient notified as well. Nothing further needed.

## 2014-11-27 NOTE — Telephone Encounter (Signed)
I don't know if he wants to start OFEV after all. He had called a few days ago saying he did not.

## 2014-11-28 ENCOUNTER — Telehealth: Payer: Self-pay | Admitting: Internal Medicine

## 2014-11-28 NOTE — Telephone Encounter (Signed)
Patient has gotten conflicting written and phone reports about whether OFEV is approved or not. Some of this was related to getting financial support, asking him for more tax information. He is inclined to try it is there is little direct cost to him, but not if it will be very expensive. Plan- I will ask Katie to clarify approval status for him He undersstands it is fine if he chooses to wait, and we can discuss at next [place] in May.

## 2014-11-28 NOTE — Telephone Encounter (Signed)
He has been approved for OFEV for UIP based on his initial decision. He called back wanting to discuss further. I LOM for him to call us again.

## 2014-12-05 ENCOUNTER — Telehealth: Payer: Self-pay | Admitting: Internal Medicine

## 2014-12-05 NOTE — Telephone Encounter (Signed)
Lorane Gell, CMA at 12/05/2014 9:10 AM     Status: Signed       Expand All Collapse All   Ivin Booty with [facility] called stating she needs to know if patient will or will NOT be starting OFEV; I explained to her that the patient is concerned with the cost of the medication and we need to see what can be done to help him. Ivin Booty stated that she had tried to explain to the patient recently that they have a program to help with cost of medication up to patient getting the medication for free. She states the patient was not understanding this and told her he would NOT be taking the medication.   I have reached out to the patient to see if I can help better explain the services [facility] has to offer him and go from there. I have had to leave a message for patient to contact me today.    Ivin Booty at [employer] [phone] (private line for Ivin Booty only)            Deneise Lever, MD at 11/28/2014 4:51 PM     Status: Signed       Expand All Collapse All   Patient has gotten conflicting written and phone reports about whether OFEV is approved or not. Some of this was related to getting financial support, asking him for more tax information. He is inclined to try it is there is little direct cost to him, but not if it will be very expensive. Plan- I will ask Haili Donofrio to clarify approval status for him He undersstands it is fine if he chooses to wait, and we can discuss at next [place] in May.          I have spoken with patient and he understands to contact [facility] to seek out assistance with payment for [place]. Pt will keep me updated as he gains information.

## 2014-12-05 NOTE — Telephone Encounter (Signed)
Ivin Booty with [facility] called stating she needs to know if patient will or will NOT be starting OFEV; I explained to her that the patient is concerned with the cost of the medication and we need to see what can be done to help him. Ivin Booty stated that she had tried to explain to the patient recently that they have a program to help with cost of medication up to patient getting the medication for free. She states the patient was not understanding this and told her he would NOT be taking the medication.   I have reached out to the patient to see if I can help better explain the services [facility] has to offer him and go from there. I have had to leave a message for patient to contact me today.    Ivin Booty at [employer] [phone] (private line for Ivin Booty only)

## 2014-12-05 NOTE — Telephone Encounter (Signed)
Duplicate message. See telephone message from 11/28/14.

## 2014-12-05 NOTE — Telephone Encounter (Signed)
[facility] calling back Jeani Hawking  On the ofev wasn't checked for nursing on the referrall  [phone] ext [phone]

## 2014-12-08 NOTE — Telephone Encounter (Signed)
Pt has received the ofev medication. Pt will wait until his appt on the 20th. However pt did not know it would already be in the mail. Should he go ahead and take the med or wait until his visit with CY. [phone]

## 2014-12-08 NOTE — Telephone Encounter (Signed)
Spoke with Jeani Hawking with [facility].  Report the "nursing" box was not checked.  They can offer up to 2 nursing visits if MD wishes to enroll pt in this.  Jeani Hawking requesting clarification.  Katie, please advise.  Per Jeani Hawking, we can call her back instead of refaxing paperwork.  Spoke with pt.  He received Ofev today.  He confirmed CY would be ok with him waiting to start medication until they discuss this further at next [place].  His appt is scheduled for May 20.  Katie, pt would like to be worked in sooner if possible as he has medication now.  Please advise.  Thank you.

## 2014-12-09 NOTE — Telephone Encounter (Signed)
Ok to check the nurse visit spaces Ok to work him in a little earlier if a spot opens up.

## 2014-12-09 NOTE — Telephone Encounter (Signed)
Please have patient come in on Thursday 12-11-14 at 3:00pm slot. Thanks.

## 2014-12-09 NOTE — Telephone Encounter (Signed)
Called pt, stated he was in a meeting and would have to call us back. Pt only needs to be scheduled for 5/12 at 3:00 appt with CY if he can make it.

## 2014-12-10 DIAGNOSIS — I10 Essential (primary) hypertension: Secondary | ICD-10-CM | POA: Diagnosis not present

## 2014-12-10 DIAGNOSIS — E1140 Type 2 diabetes mellitus with diabetic neuropathy, unspecified: Secondary | ICD-10-CM | POA: Diagnosis not present

## 2014-12-10 DIAGNOSIS — E780 Pure hypercholesterolemia: Secondary | ICD-10-CM | POA: Diagnosis not present

## 2014-12-10 DIAGNOSIS — J8410 Pulmonary fibrosis, unspecified: Secondary | ICD-10-CM | POA: Diagnosis not present

## 2014-12-11 ENCOUNTER — Encounter: Payer: Self-pay | Admitting: Internal Medicine

## 2014-12-11 ENCOUNTER — Ambulatory Visit (INDEPENDENT_AMBULATORY_CARE_PROVIDER_SITE_OTHER): Payer: Medicare Other | Admitting: Internal Medicine

## 2014-12-11 VITALS — BP 140/68 | HR 84 | Ht 70.0 in | Wt 159.0 lb

## 2014-12-11 DIAGNOSIS — J8410 Pulmonary fibrosis, unspecified: Secondary | ICD-10-CM | POA: Diagnosis not present

## 2014-12-11 NOTE — Progress Notes (Signed)
08/28/14-  81 yo M former smoker referred courtesy of Dr Felipa Eth for interstitial lung disease CXR 12/11/115- images reviewed with him IMPRESSION: Borderline cardiomegaly. Peripheral and bilateral basilar reticular interstitial prominence suspicious for chronic interstitial lung disease or fibrotic changes. No definite superimposed infiltrate or pulmonary edema. Electronically Signed  By: Lahoma Crocker M.D.  On: 07/11/2014 12:00 In the 1990s a chest x-ray showed "scar tissue" attributed to a bad pneumonia he had in the [employer] in the 1940s. Now, for the last 2 months, he has noticed dry cough with superimposed chest cold caught from his wife. Yellow sputum cleared. Still has morning cough productive of clear sputum and watery rhinorrhea. Not really short of breath but easier dyspnea on exertion and doesn't feel he can take as deep a breath as normal. In just the last few days he has noticed some wheeze. History of reflux symptoms after eating treated with Tums tablets. This may wake him occasionally but has not seemed severe. Easy choking while eating. Smoked a pipe in the mid 1980s. 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  09/22/14-documentation for 6 minute walk test 92%, 88%, 93%, 555 feet. Oxygen desaturation during this exercise with good recovery upon resting.  10/10/14-  66 yo M former smoker referred courtesy of Dr Felipa Eth for interstitial lung disease/ UIP FOLLOWS FOR: review PFT and 6MW test with patient. Here today with Mrs. Earney Hamburg nurse He describes having gotten over a cold but does not distinguish well. He apparently has at least morning cough most days with some clear phlegm or dry cough. He denies waking choking or aware of reflux. Bothersome rhinorrhea he says is actually his main complaint. Aware of dyspnea on exertion climbing stairs. 6 MWT-6 minute walk test 92%, 88%, 93%, 555 feet. Oxygen desaturation during this exercise with good  recovery upon resting. PFT 09/22/14- severe restriction, TLC 37%, DLCO33% CT chest HiDef IMPRESSION: 1. The appearance of the lungs is compatible with interstitial lung disease, and the pattern is considered diagnostic of usual interstitial pneumonia (UIP) from an imaging standpoint. 2. Atherosclerosis, including 3 vessel coronary artery disease. 3. Additional incidental findings, as above. Electronically Signed  By: Vinnie Langton M.D.  On: 09/05/2014 14:24  12/11/14- 69 yo M former smoker referred courtesy of Dr Felipa Eth for interstitial lung disease/ UIP Follows For: Pt c/o SOB with activity, prod cough at times with thick white mucus. Uses 2L O2 at bedtime.  Here to discuss whether he wants to start Ofev Bothered by different contacts from insurance, our office, the home therapy company, and the patient financial support representative from the [place] ([employer]).  ROS-see HPI Constitutional:   No-   weight loss, night sweats, fevers, chills, fatigue, lassitude. HEENT:   No-  headaches, +difficulty swallowing, tooth/dental problems, sore throat,       No-  sneezing, itching, ear ache, nasal congestion, +post nasal drip,  CV:  No-   chest pain, orthopnea, PND, swelling in lower extremities, anasarca,                                  dizziness, palpitations Resp: + shortness of breath with exertion or at rest.              + productive cough,  +No non-productive cough,  No- coughing up of blood.              No-   change in color  of mucus.  wheezing.   Skin: No-   rash or lesions. GI:  +heartburn, indigestion, no-abdominal pain, nausea, vomiting,  GU: . MS:  No-   joint pain or swelling.  . Neuro-     nothing unusual Psych:  No- change in mood or affect. No depression or anxiety.  No memory loss.  OBJ- Physical Exam    Tall, slender, healthy appearing man General- Alert, Oriented, Affect-appropriate, Distress- none acute Skin- rash-none, lesions- none, excoriation-  none Lymphadenopathy- none Head- atraumatic            Eyes- Gross vision intact, PERRLA, conjunctivae and secretions clear            Ears- Hearing, canals-normal            Nose- Clear, no-Septal dev, mucus, polyps, erosion, perforation             Throat- Mallampati II , mucosa clear , drainage- none, tonsils- atrophic Neck- flexible , trachea midline, no stridor , thyroid nl, carotid no bruit Chest - symmetrical excursion , unlabored           Heart/CV- RRR , no murmur , no gallop  , no rub, nl s1 s2                           - JVD- none , edema- none, stasis changes- none, varices- none           Lung- +  crackles right greater than left, wheeze- none, cough+ raspy , dullness-none, rub- none           Chest wall-  Abd-  Br/ Gen/ Rectal- Not done, not indicated Extrem- cyanosis- none, clubbing- none, atrophy- none, strength- nl Neuro- grossly intact to observation

## 2014-12-11 NOTE — Patient Instructions (Signed)
We agreed to start OFEV at half- strength, 150 mg daily, using the 150 mg capsules you now have.  After a week or two, if you are tolerating it well, we can build to 1 capsule twice daily.  Follow the guidance in the product information materials you have for stomach discomfort.   Order future lab- liver function panel for dx pulmonary fibrosis, Interstitial lung disease, to be done in one month

## 2014-12-19 ENCOUNTER — Ambulatory Visit: Payer: Medicare Other | Admitting: Internal Medicine

## 2014-12-23 NOTE — Telephone Encounter (Signed)
Message is going to be closed due to pt not contacting us back.

## 2014-12-30 NOTE — Assessment & Plan Note (Signed)
Symptoms and exam not changing fast. I've made every effort to explain the goals, pros and cons of available treatment and to answer his questions. Perfectly reasonable if he chooses not to take it, particularly at his age. He still expresses desire to live long enough to care for his wife, but realistic.  Plan I suggested he start at 150 mg/ day dose and see how that feels. He agreed. LFTs every 3 months.

## 2015-01-28 DIAGNOSIS — M6281 Muscle weakness (generalized): Secondary | ICD-10-CM | POA: Diagnosis not present

## 2015-01-30 DIAGNOSIS — M6281 Muscle weakness (generalized): Secondary | ICD-10-CM | POA: Diagnosis not present

## 2015-02-03 DIAGNOSIS — M6281 Muscle weakness (generalized): Secondary | ICD-10-CM | POA: Diagnosis not present

## 2015-02-05 DIAGNOSIS — M6281 Muscle weakness (generalized): Secondary | ICD-10-CM | POA: Diagnosis not present

## 2015-02-06 DIAGNOSIS — M6281 Muscle weakness (generalized): Secondary | ICD-10-CM | POA: Diagnosis not present

## 2015-02-09 DIAGNOSIS — M6281 Muscle weakness (generalized): Secondary | ICD-10-CM | POA: Diagnosis not present

## 2015-02-11 DIAGNOSIS — M6281 Muscle weakness (generalized): Secondary | ICD-10-CM | POA: Diagnosis not present

## 2015-02-16 DIAGNOSIS — M6281 Muscle weakness (generalized): Secondary | ICD-10-CM | POA: Diagnosis not present

## 2015-02-17 DIAGNOSIS — M6281 Muscle weakness (generalized): Secondary | ICD-10-CM | POA: Diagnosis not present

## 2015-02-18 DIAGNOSIS — M6281 Muscle weakness (generalized): Secondary | ICD-10-CM | POA: Diagnosis not present

## 2015-03-03 ENCOUNTER — Ambulatory Visit (INDEPENDENT_AMBULATORY_CARE_PROVIDER_SITE_OTHER): Payer: Medicare Other | Admitting: Internal Medicine

## 2015-03-03 ENCOUNTER — Ambulatory Visit (INDEPENDENT_AMBULATORY_CARE_PROVIDER_SITE_OTHER)
Admission: RE | Admit: 2015-03-03 | Discharge: 2015-03-03 | Disposition: A | Discharge status: Home / Self Care | Payer: Medicare Other | Source: Ambulatory Visit | Attending: Internal Medicine | Admitting: Internal Medicine

## 2015-03-03 ENCOUNTER — Other Ambulatory Visit (INDEPENDENT_AMBULATORY_CARE_PROVIDER_SITE_OTHER): Payer: Medicare Other

## 2015-03-03 ENCOUNTER — Encounter: Payer: Self-pay | Admitting: Internal Medicine

## 2015-03-03 VITALS — BP 132/72 | HR 80 | Ht 70.0 in | Wt 157.6 lb

## 2015-03-03 DIAGNOSIS — J8489 Other specified interstitial pulmonary diseases: Secondary | ICD-10-CM | POA: Diagnosis not present

## 2015-03-03 DIAGNOSIS — J8410 Pulmonary fibrosis, unspecified: Secondary | ICD-10-CM

## 2015-03-03 DIAGNOSIS — R627 Adult failure to thrive: Secondary | ICD-10-CM | POA: Insufficient documentation

## 2015-03-03 DIAGNOSIS — R634 Abnormal weight loss: Secondary | ICD-10-CM

## 2015-03-03 LAB — HEPATIC FUNCTION PANEL
ALK PHOS: 69 U/L (ref 39–117)
ALT: 20 U/L (ref 0–53)
AST: 23 U/L (ref 0–37)
Albumin: 3.9 g/dL (ref 3.5–5.2)
BILIRUBIN DIRECT: 0.1 mg/dL (ref 0.0–0.3)
TOTAL PROTEIN: 7.3 g/dL (ref 6.0–8.3)
Total Bilirubin: 0.4 mg/dL (ref 0.2–1.2)

## 2015-03-03 NOTE — Patient Instructions (Signed)
Order- CXR   Dx pulmonary fibrosis, UIP              Lab-  Hepatic function panel  We can continue the OFEV 150 mg twice daily  Please call as needed

## 2015-03-03 NOTE — Progress Notes (Signed)
08/28/14-  81 yo M former smoker referred courtesy of Dr Felipa Eth for interstitial lung disease CXR 12/11/115- images reviewed with him IMPRESSION: Borderline cardiomegaly. Peripheral and bilateral basilar reticular interstitial prominence suspicious for chronic interstitial lung disease or fibrotic changes. No definite superimposed infiltrate or pulmonary edema. Electronically Signed  By: Lahoma Crocker M.D.  On: 07/11/2014 12:00 In the 1990s a chest x-ray showed "scar tissue" attributed to a bad pneumonia he had in the [employer] in the 1940s. Now, for the last 2 months, he has noticed dry cough with superimposed chest cold caught from his wife. Yellow sputum cleared. Still has morning cough productive of clear sputum and watery rhinorrhea. Not really short of breath but easier dyspnea on exertion and doesn't feel he can take as deep a breath as normal. In just the last few days he has noticed some wheeze. History of reflux symptoms after eating treated with Tums tablets. This may wake him occasionally but has not seemed severe. Easy choking while eating. Smoked a pipe in the mid 1980s. 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  09/22/14-documentation for 6 minute walk test 92%, 88%, 93%, 555 feet. Oxygen desaturation during this exercise with good recovery upon resting.  10/10/14-  81 yo M former smoker referred courtesy of Dr Felipa Eth for interstitial lung disease/ UIP FOLLOWS FOR: review PFT and 6MW test with patient. Here today with Mrs. Earney Hamburg nurse He describes having gotten over a cold but does not distinguish well. He apparently has at least morning cough most days with some clear phlegm or dry cough. He denies waking choking or aware of reflux. Bothersome rhinorrhea he says is actually his main complaint. Aware of dyspnea on exertion climbing stairs. 6 MWT-6 minute walk test 92%, 88%, 93%, 555 feet. Oxygen desaturation during this exercise with good  recovery upon resting. PFT 09/22/14- severe restriction, TLC 37%, DLCO33% CT chest HiRes 09/05/14 IMPRESSION: 1. The appearance of the lungs is compatible with interstitial lung disease, and the pattern is considered diagnostic of usual interstitial pneumonia (UIP) from an imaging standpoint. 2. Atherosclerosis, including 3 vessel coronary artery disease. 3. Additional incidental findings, as above. Electronically Signed  By: Vinnie Langton M.D.  On: 09/05/2014 14:24  12/11/14- 81 yo M former smoker referred courtesy of Dr Felipa Eth for interstitial lung disease/ UIP Follows For: Pt c/o SOB with activity, prod cough at times with thick white mucus. Uses 2L O2 at bedtime.  Here to discuss whether he wants to start Ofev Bothered by different contacts from insurance, our office, the home therapy company, and the patient financial support representative from the [place] ([employer]).  03/03/15- 81 yo M former smoker referred courtesy of Dr Felipa Eth for interstitial lung disease/ UIP OFEV started 11/2014 Did not get LFTs drawn 5/12 Follow For: Pt currently on OFEV 150mg  BID. No LFT since 09/2014. Tolerating med at current dose. Breathing is better since last visit. Wife recently diagnosed with metastatic colon cancer. Supporting her had been his major reason to start therapy for his UIP. Both have actually makes him feel better by using chronic constipation. Chest feels better. It used to be uncomfortable to take a deep breath but he feels normal again. Still dyspnea on exertion walking. Admits not much appetite and has lost 6 pounds from 163 pounds to 157 pounds since January. This began before OFEV.  ROS-see HPI Constitutional:   + weight loss, night sweats, fevers, chills, fatigue, lassitude. HEENT:   No-  headaches, +difficulty swallowing, tooth/dental problems, sore  throat,       No-  sneezing, itching, ear ache, nasal congestion, +post nasal drip,  CV:  No-   chest pain, orthopnea, PND,  swelling in lower extremities, anasarca,                                                             dizziness, palpitations Resp: + shortness of breath with exertion or at rest.              productive cough,   non-productive cough,  No- coughing up of blood.              No-   change in color of mucus.  wheezing.   Skin: No-   rash or lesions. GI:  +heartburn, indigestion, no-abdominal pain, nausea, vomiting,  GU: . MS:  No-   joint pain or swelling.  . Neuro-     nothing unusual Psych:  No- change in mood or affect. No depression or anxiety.  No memory loss.  OBJ- Physical Exam    Tall, slender, healthy appearing man General- Alert, Oriented, Affect-appropriate, Distress- none acute Skin- rash-none, lesions- none, excoriation- none Lymphadenopathy- none Head- atraumatic            Eyes- Gross vision intact, PERRLA, conjunctivae and secretions clear            Ears- Hearing, canals-normal            Nose- Clear, no-Septal dev, mucus, polyps, erosion, perforation             Throat- Mallampati II , mucosa clear , drainage- none, tonsils- atrophic Neck- flexible , trachea midline, no stridor , thyroid nl, carotid no bruit Chest - symmetrical excursion , unlabored           Heart/CV- RRR , no murmur , no gallop  , no rub, nl s1 s2                           - JVD- none , edema- none, stasis changes- none, varices- none           Lung- +  crackles minimal, wheeze- none, cough+ raspy , dullness-none, rub- none           Chest wall-  Abd-  Br/ Gen/ Rectal- Not done, not indicated Extrem- cyanosis- none, clubbing- none, atrophy- none, strength- nl Neuro- grossly intact to observation

## 2015-03-03 NOTE — Assessment & Plan Note (Signed)
6 pound weight loss in 8 months is not dramatic but he was already trim. He just describes loss of appetite. Since of smell and taste reported normal. Denies abdominal pain. Plan-he will discuss this with his primary physician. We talked about adding Ensure and he is encouraged to eat anything he wants.

## 2015-03-03 NOTE — Assessment & Plan Note (Signed)
Interesting that he feels better in his chest with less cough and fewer audible crackles. Plan-continue OFEV, , LFT for medication side effect tracking, CXR

## 2015-04-02 ENCOUNTER — Telehealth: Payer: Self-pay | Admitting: Internal Medicine

## 2015-04-02 NOTE — Telephone Encounter (Signed)
Reduce Ofev to one capsule, once daily  Please remind him of the nurse call number in his Ofev packet. He can call them tomorrow for more advice.

## 2015-04-02 NOTE — Telephone Encounter (Signed)
Spoke with pt, aware of recs.  States he called the nurse line today, who directed him to call his local doctor.   Pt is mostly concerned with the lack of appetite-states his nausea is manageable when he eats with his med but he will decrease ofev.   Please advise on decreased appetite-pt is going in to dinner and is ok with hearing a response tomorrow.    CY please advise.  Thanks!

## 2015-04-02 NOTE — Telephone Encounter (Signed)
I suggest watching appetite through the long weekend on lower dose Ofev.

## 2015-04-02 NOTE — Telephone Encounter (Signed)
Spoke with pt, states that Ofev has been making pt nauseous Xapprox 2 weeks.   Pt was not eating while taking Ofev- pt states that he is now taking Ofev with food which is helping nausea but it is still present.   Pt also c/o decreased appetite-requesting recs.  Pt states something was mentioned about drinking a glass of orange juice with his med?  CY please advise.  Thanks!

## 2015-04-02 NOTE — Telephone Encounter (Signed)
Per CY-please have patient go down to 1 tablet qd of Ofev and contact the nurse line for Ofev. Thanks.

## 2015-04-03 NOTE — Telephone Encounter (Signed)
Patient notified. Verbalized understanding. Nothing further needed.   

## 2015-04-13 ENCOUNTER — Telehealth: Payer: Self-pay | Admitting: Internal Medicine

## 2015-04-13 NOTE — Telephone Encounter (Signed)
I think it is ok to continue as he is doing with one daily.

## 2015-04-13 NOTE — Telephone Encounter (Signed)
Called spoke with pt. He has cut down the OFEV to 150 mg once daily. He is feeling some better. He still has some slight nausea but the cough has now came back. Reports this is no unbearable yet. He wanted to give an update to Dr. Annamaria Boots. Wants to ensure it is okay for him to continue the medication like this. Please advise Dr. Annamaria Boots thanks

## 2015-04-13 NOTE — Telephone Encounter (Signed)
I called made pt aware. Nothing further needed 

## 2015-05-05 DIAGNOSIS — H02834 Dermatochalasis of left upper eyelid: Secondary | ICD-10-CM | POA: Diagnosis not present

## 2015-05-05 DIAGNOSIS — H04123 Dry eye syndrome of bilateral lacrimal glands: Secondary | ICD-10-CM | POA: Diagnosis not present

## 2015-05-05 DIAGNOSIS — E119 Type 2 diabetes mellitus without complications: Secondary | ICD-10-CM | POA: Diagnosis not present

## 2015-05-05 DIAGNOSIS — D3132 Benign neoplasm of left choroid: Secondary | ICD-10-CM | POA: Diagnosis not present

## 2015-05-05 DIAGNOSIS — H40013 Open angle with borderline findings, low risk, bilateral: Secondary | ICD-10-CM | POA: Diagnosis not present

## 2015-05-05 DIAGNOSIS — H02831 Dermatochalasis of right upper eyelid: Secondary | ICD-10-CM | POA: Diagnosis not present

## 2015-06-03 DIAGNOSIS — M6281 Muscle weakness (generalized): Secondary | ICD-10-CM | POA: Diagnosis not present

## 2015-06-03 DIAGNOSIS — R278 Other lack of coordination: Secondary | ICD-10-CM | POA: Diagnosis not present

## 2015-06-04 DIAGNOSIS — M6281 Muscle weakness (generalized): Secondary | ICD-10-CM | POA: Diagnosis not present

## 2015-06-04 DIAGNOSIS — R278 Other lack of coordination: Secondary | ICD-10-CM | POA: Diagnosis not present

## 2015-06-09 DIAGNOSIS — M6281 Muscle weakness (generalized): Secondary | ICD-10-CM | POA: Diagnosis not present

## 2015-06-09 DIAGNOSIS — R278 Other lack of coordination: Secondary | ICD-10-CM | POA: Diagnosis not present

## 2015-06-11 DIAGNOSIS — M6281 Muscle weakness (generalized): Secondary | ICD-10-CM | POA: Diagnosis not present

## 2015-06-11 DIAGNOSIS — R278 Other lack of coordination: Secondary | ICD-10-CM | POA: Diagnosis not present

## 2015-06-15 DIAGNOSIS — R278 Other lack of coordination: Secondary | ICD-10-CM | POA: Diagnosis not present

## 2015-06-15 DIAGNOSIS — M6281 Muscle weakness (generalized): Secondary | ICD-10-CM | POA: Diagnosis not present

## 2015-06-17 DIAGNOSIS — M6281 Muscle weakness (generalized): Secondary | ICD-10-CM | POA: Diagnosis not present

## 2015-06-17 DIAGNOSIS — R278 Other lack of coordination: Secondary | ICD-10-CM | POA: Diagnosis not present

## 2015-06-19 DIAGNOSIS — M6281 Muscle weakness (generalized): Secondary | ICD-10-CM | POA: Diagnosis not present

## 2015-06-19 DIAGNOSIS — R278 Other lack of coordination: Secondary | ICD-10-CM | POA: Diagnosis not present

## 2015-06-22 DIAGNOSIS — R278 Other lack of coordination: Secondary | ICD-10-CM | POA: Diagnosis not present

## 2015-06-22 DIAGNOSIS — M6281 Muscle weakness (generalized): Secondary | ICD-10-CM | POA: Diagnosis not present

## 2015-06-24 DIAGNOSIS — R278 Other lack of coordination: Secondary | ICD-10-CM | POA: Diagnosis not present

## 2015-06-24 DIAGNOSIS — M6281 Muscle weakness (generalized): Secondary | ICD-10-CM | POA: Diagnosis not present

## 2015-06-27 DIAGNOSIS — M6281 Muscle weakness (generalized): Secondary | ICD-10-CM | POA: Diagnosis not present

## 2015-06-27 DIAGNOSIS — R278 Other lack of coordination: Secondary | ICD-10-CM | POA: Diagnosis not present

## 2015-06-30 DIAGNOSIS — M6281 Muscle weakness (generalized): Secondary | ICD-10-CM | POA: Diagnosis not present

## 2015-06-30 DIAGNOSIS — R278 Other lack of coordination: Secondary | ICD-10-CM | POA: Diagnosis not present

## 2015-07-01 DIAGNOSIS — R278 Other lack of coordination: Secondary | ICD-10-CM | POA: Diagnosis not present

## 2015-07-01 DIAGNOSIS — M6281 Muscle weakness (generalized): Secondary | ICD-10-CM | POA: Diagnosis not present

## 2015-07-02 DIAGNOSIS — R278 Other lack of coordination: Secondary | ICD-10-CM | POA: Diagnosis not present

## 2015-07-02 DIAGNOSIS — M6281 Muscle weakness (generalized): Secondary | ICD-10-CM | POA: Diagnosis not present

## 2015-07-03 ENCOUNTER — Ambulatory Visit (INDEPENDENT_AMBULATORY_CARE_PROVIDER_SITE_OTHER): Payer: Medicare Other | Admitting: Internal Medicine

## 2015-07-03 ENCOUNTER — Encounter: Payer: Self-pay | Admitting: Internal Medicine

## 2015-07-03 VITALS — BP 122/70 | HR 87 | Ht 70.0 in | Wt 155.8 lb

## 2015-07-03 DIAGNOSIS — J9611 Chronic respiratory failure with hypoxia: Secondary | ICD-10-CM | POA: Insufficient documentation

## 2015-07-03 DIAGNOSIS — J8410 Pulmonary fibrosis, unspecified: Secondary | ICD-10-CM | POA: Diagnosis not present

## 2015-07-03 NOTE — Patient Instructions (Addendum)
Okay to try going back to twice daily OFEV if tolerated. If it bothers your stomach then stay at 1 daily.  Continue oxygen while sleeping. You can put it on while sitting and resting anytime you need. We will watch your oxygen needs during activity.  Please call as needed  Ok to try a little wine before supper to see if it helps your appetite. We will keep track of your liver function while on OFEV

## 2015-07-03 NOTE — Assessment & Plan Note (Signed)
Tolerating OFEV well taken once daily. He can try going back to twice daily. We will continue to track liver function from time to time

## 2015-07-03 NOTE — Assessment & Plan Note (Signed)
Plan-continue to sleep with oxygen at 2 L. He understands to pace himself and that he may sit wearing oxygen at home if needed. He hopes to avoid portable or continuous oxygen. We will see how that does.

## 2015-07-03 NOTE — Progress Notes (Signed)
08/28/14-  81 yo M former smoker referred courtesy of Dr Felipa Eth for interstitial lung disease CXR 12/11/115- images reviewed with him IMPRESSION: Borderline cardiomegaly. Peripheral and bilateral basilar reticular interstitial prominence suspicious for chronic interstitial lung disease or fibrotic changes. No definite superimposed infiltrate or pulmonary edema. Electronically Signed  By: Lahoma Crocker M.D.  On: 07/11/2014 12:00 In the 1990s a chest x-ray showed "scar tissue" attributed to a bad pneumonia he had in the [employer] in the 1940s. Now, for the last 2 months, he has noticed dry cough with superimposed chest cold caught from his wife. Yellow sputum cleared. Still has morning cough productive of clear sputum and watery rhinorrhea. Not really short of breath but easier dyspnea on exertion and doesn't feel he can take as deep a breath as normal. In just the last few days he has noticed some wheeze. History of reflux symptoms after eating treated with Tums tablets. This may wake him occasionally but has not seemed severe. Easy choking while eating. Smoked a pipe in the mid 1980s. 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  09/22/14-documentation for 6 minute walk test 92%, 88%, 93%, 555 feet. Oxygen desaturation during this exercise with good recovery upon resting.  10/10/14-  68 yo M former smoker referred courtesy of Dr Felipa Eth for interstitial lung disease/ UIP FOLLOWS FOR: review PFT and 6MW test with patient. Here today with Mrs. Earney Hamburg nurse He describes having gotten over a cold but does not distinguish well. He apparently has at least morning cough most days with some clear phlegm or dry cough. He denies waking choking or aware of reflux. Bothersome rhinorrhea he says is actually his main complaint. Aware of dyspnea on exertion climbing stairs. 6 MWT-6 minute walk test 92%, 88%, 93%, 555 feet. Oxygen desaturation during this exercise with good  recovery upon resting. PFT 09/22/14- severe restriction, TLC 37%, DLCO33% CT chest HiRes 09/05/14 IMPRESSION: 1. The appearance of the lungs is compatible with interstitial lung disease, and the pattern is considered diagnostic of usual interstitial pneumonia (UIP) from an imaging standpoint. 2. Atherosclerosis, including 3 vessel coronary artery disease. 3. Additional incidental findings, as above. Electronically Signed  By: Vinnie Langton M.D.  On: 09/05/2014 14:24  12/11/14- 56 yo M former smoker referred courtesy of Dr Felipa Eth for interstitial lung disease/ UIP Follows For: Pt c/o SOB with activity, prod cough at times with thick white mucus. Uses 2L O2 at bedtime.  Here to discuss whether he wants to start Ofev Bothered by different contacts from insurance, our office, the home therapy company, and the patient financial support representative from the [place] ([employer]).  03/03/15- 5 yo M former smoker referred courtesy of Dr Felipa Eth for interstitial lung disease/ UIP OFEV started 11/2014 Did not get LFTs drawn 5/12 Follow For: Pt currently on OFEV 150mg  BID. No LFT since 09/2014. Tolerating med at current dose. Breathing is better since last visit. Wife recently diagnosed with metastatic colon cancer. Supporting her had been his major reason to start therapy for his UIP. Both have actually makes him feel better by using chronic constipation. Chest feels better. It used to be uncomfortable to take a deep breath but he feels normal again. Still dyspnea on exertion walking. Admits not much appetite and has lost 6 pounds from 163 pounds to 157 pounds since January. This began before OFEV.  07/03/2015-81 year old male former smoker followed for ILD/UIP OFEV reduced from twice daily to once daily because of GI distress FOLLOWS FOR: Pt continues taking OFEV  150mg  once daily. Pt states he has noticed fatigue and slight tickle in throat. O2 2L sleep/ [facility] OFEV was reduced from twice  to once daily because of nausea. Much better on once daily. Also added Prilosec and taking OFEV after meals. He asks about help with appetite and I gave permission to have a glass of wine before supper. So far liver functions have remained good. Therapist at home recorded oxygen saturation with activity between 84 and 88%. He sleeps with oxygen but does not have portable. Wife now declining with recurrent colon cancer/Hospice CXR 03/03/2015- reviewed with him IMPRESSION: Chronic stable pulmonary fibrotic changes. There is no alveolar pneumonia or pulmonary vascular congestion. Electronically Signed  By: David Martinique M.D.  On: 03/03/2015 14:17   ROS-see HPI Constitutional:   + weight loss, night sweats, fevers, chills, fatigue, lassitude. HEENT:   No-  headaches, +difficulty swallowing, tooth/dental problems, sore throat,       No-  sneezing, itching, ear ache, nasal congestion, +post nasal drip,  CV:  No-   chest pain, orthopnea, PND, swelling in lower extremities, anasarca,                                                             dizziness, palpitations Resp: + shortness of breath with exertion or at rest.              productive cough,   non-productive cough,  No- coughing up of blood.              No-   change in color of mucus.  wheezing.   Skin: No-   rash or lesions. GI:  +heartburn, indigestion, no-abdominal pain, nausea, vomiting,  GU: . MS:  No-   joint pain or swelling.  . Neuro-     nothing unusual Psych:  No- change in mood or affect. No depression or anxiety.  No memory loss.  OBJ- Physical Exam    Tall, slender, healthy appearing man    + Room Air Sat 93% at rest today General- Alert, Oriented, Affect-appropriate, Distress- none acute Skin- rash-none, lesions- none, excoriation- none Lymphadenopathy- none Head- atraumatic            Eyes- Gross vision intact, PERRLA, conjunctivae and secretions clear            Ears- Hearing, canals-normal            Nose-  Clear, no-Septal dev, mucus, polyps, erosion, perforation             Throat- Mallampati II , mucosa clear , drainage- none, tonsils- atrophic Neck- flexible , trachea midline, no stridor , thyroid nl, carotid no bruit Chest - symmetrical excursion , unlabored           Heart/CV- RRR , no murmur , no gallop  , no rub, nl s1 s2                           - JVD- none , edema- none, stasis changes- none, varices- none           Lung- +  crackles minimal, wheeze- none, cough+ raspy , dullness-none, rub- none           Chest wall-  Abd-  Br/ Gen/ Rectal-  Not done, not indicated Extrem- cyanosis- none, clubbing- none, atrophy- none, strength- nl Neuro- grossly intact to observation

## 2015-07-07 DIAGNOSIS — M6281 Muscle weakness (generalized): Secondary | ICD-10-CM | POA: Diagnosis not present

## 2015-07-07 DIAGNOSIS — R278 Other lack of coordination: Secondary | ICD-10-CM | POA: Diagnosis not present

## 2015-07-09 DIAGNOSIS — M6281 Muscle weakness (generalized): Secondary | ICD-10-CM | POA: Diagnosis not present

## 2015-07-09 DIAGNOSIS — R278 Other lack of coordination: Secondary | ICD-10-CM | POA: Diagnosis not present

## 2015-07-10 DIAGNOSIS — R278 Other lack of coordination: Secondary | ICD-10-CM | POA: Diagnosis not present

## 2015-07-10 DIAGNOSIS — M6281 Muscle weakness (generalized): Secondary | ICD-10-CM | POA: Diagnosis not present

## 2015-07-13 DIAGNOSIS — M6281 Muscle weakness (generalized): Secondary | ICD-10-CM | POA: Diagnosis not present

## 2015-07-13 DIAGNOSIS — R278 Other lack of coordination: Secondary | ICD-10-CM | POA: Diagnosis not present

## 2015-07-15 DIAGNOSIS — R278 Other lack of coordination: Secondary | ICD-10-CM | POA: Diagnosis not present

## 2015-07-15 DIAGNOSIS — M6281 Muscle weakness (generalized): Secondary | ICD-10-CM | POA: Diagnosis not present

## 2015-07-16 DIAGNOSIS — M6281 Muscle weakness (generalized): Secondary | ICD-10-CM | POA: Diagnosis not present

## 2015-07-16 DIAGNOSIS — R278 Other lack of coordination: Secondary | ICD-10-CM | POA: Diagnosis not present

## 2015-07-20 DIAGNOSIS — M6281 Muscle weakness (generalized): Secondary | ICD-10-CM | POA: Diagnosis not present

## 2015-07-20 DIAGNOSIS — R278 Other lack of coordination: Secondary | ICD-10-CM | POA: Diagnosis not present

## 2015-07-23 DIAGNOSIS — R278 Other lack of coordination: Secondary | ICD-10-CM | POA: Diagnosis not present

## 2015-07-23 DIAGNOSIS — M6281 Muscle weakness (generalized): Secondary | ICD-10-CM | POA: Diagnosis not present

## 2015-07-24 DIAGNOSIS — M6281 Muscle weakness (generalized): Secondary | ICD-10-CM | POA: Diagnosis not present

## 2015-07-24 DIAGNOSIS — R278 Other lack of coordination: Secondary | ICD-10-CM | POA: Diagnosis not present

## 2015-07-27 DIAGNOSIS — R278 Other lack of coordination: Secondary | ICD-10-CM | POA: Diagnosis not present

## 2015-07-27 DIAGNOSIS — M6281 Muscle weakness (generalized): Secondary | ICD-10-CM | POA: Diagnosis not present

## 2015-07-28 DIAGNOSIS — Z961 Presence of intraocular lens: Secondary | ICD-10-CM | POA: Diagnosis not present

## 2015-07-28 DIAGNOSIS — E119 Type 2 diabetes mellitus without complications: Secondary | ICD-10-CM | POA: Diagnosis not present

## 2015-07-28 DIAGNOSIS — H35372 Puckering of macula, left eye: Secondary | ICD-10-CM | POA: Diagnosis not present

## 2015-07-28 DIAGNOSIS — H353131 Nonexudative age-related macular degeneration, bilateral, early dry stage: Secondary | ICD-10-CM | POA: Diagnosis not present

## 2015-07-29 DIAGNOSIS — Z79899 Other long term (current) drug therapy: Secondary | ICD-10-CM | POA: Diagnosis not present

## 2015-07-29 DIAGNOSIS — I10 Essential (primary) hypertension: Secondary | ICD-10-CM | POA: Diagnosis not present

## 2015-07-29 DIAGNOSIS — E1140 Type 2 diabetes mellitus with diabetic neuropathy, unspecified: Secondary | ICD-10-CM | POA: Diagnosis not present

## 2015-07-29 DIAGNOSIS — J8410 Pulmonary fibrosis, unspecified: Secondary | ICD-10-CM | POA: Diagnosis not present

## 2015-07-29 DIAGNOSIS — R682 Dry mouth, unspecified: Secondary | ICD-10-CM | POA: Diagnosis not present

## 2015-07-29 DIAGNOSIS — Z1389 Encounter for screening for other disorder: Secondary | ICD-10-CM | POA: Diagnosis not present

## 2015-07-29 DIAGNOSIS — Z0000 Encounter for general adult medical examination without abnormal findings: Secondary | ICD-10-CM | POA: Diagnosis not present

## 2015-07-29 DIAGNOSIS — E7800 Pure hypercholesterolemia, unspecified: Secondary | ICD-10-CM | POA: Diagnosis not present

## 2015-08-04 DIAGNOSIS — M6281 Muscle weakness (generalized): Secondary | ICD-10-CM | POA: Diagnosis not present

## 2015-08-04 DIAGNOSIS — R278 Other lack of coordination: Secondary | ICD-10-CM | POA: Diagnosis not present

## 2015-08-06 DIAGNOSIS — R278 Other lack of coordination: Secondary | ICD-10-CM | POA: Diagnosis not present

## 2015-08-06 DIAGNOSIS — M6281 Muscle weakness (generalized): Secondary | ICD-10-CM | POA: Diagnosis not present

## 2015-08-11 DIAGNOSIS — M6281 Muscle weakness (generalized): Secondary | ICD-10-CM | POA: Diagnosis not present

## 2015-08-11 DIAGNOSIS — R278 Other lack of coordination: Secondary | ICD-10-CM | POA: Diagnosis not present

## 2015-08-12 DIAGNOSIS — R278 Other lack of coordination: Secondary | ICD-10-CM | POA: Diagnosis not present

## 2015-08-12 DIAGNOSIS — M6281 Muscle weakness (generalized): Secondary | ICD-10-CM | POA: Diagnosis not present

## 2015-08-14 DIAGNOSIS — R278 Other lack of coordination: Secondary | ICD-10-CM | POA: Diagnosis not present

## 2015-08-14 DIAGNOSIS — M6281 Muscle weakness (generalized): Secondary | ICD-10-CM | POA: Diagnosis not present

## 2015-08-18 ENCOUNTER — Encounter: Payer: Self-pay | Admitting: Internal Medicine

## 2015-08-18 ENCOUNTER — Ambulatory Visit (INDEPENDENT_AMBULATORY_CARE_PROVIDER_SITE_OTHER)
Admission: RE | Admit: 2015-08-18 | Discharge: 2015-08-18 | Disposition: A | Discharge status: Home / Self Care | Payer: Medicare Other | Source: Ambulatory Visit | Attending: Internal Medicine | Admitting: Internal Medicine

## 2015-08-18 ENCOUNTER — Ambulatory Visit (INDEPENDENT_AMBULATORY_CARE_PROVIDER_SITE_OTHER): Payer: Medicare Other | Admitting: Internal Medicine

## 2015-08-18 VITALS — BP 118/60 | HR 100 | Ht 70.0 in | Wt 152.0 lb

## 2015-08-18 DIAGNOSIS — J9611 Chronic respiratory failure with hypoxia: Secondary | ICD-10-CM

## 2015-08-18 DIAGNOSIS — J8410 Pulmonary fibrosis, unspecified: Secondary | ICD-10-CM

## 2015-08-18 NOTE — Assessment & Plan Note (Signed)
I expect him to continue to need oxygen during sleep but we need to requalify for Medicare Plan-overnight oximetry on room air

## 2015-08-18 NOTE — Patient Instructions (Addendum)
Order-  DME [facility] on room air to requalify for O2 during sleep      Dx UIP/ ILD  We can continue OFEV twice daily   Order- CXR   Dx UIP/ ILD  Please call as needed

## 2015-08-18 NOTE — Assessment & Plan Note (Signed)
Clinically stable. He is still holding on to care for his wife Plan continue present strategy. CXR

## 2015-08-18 NOTE — Progress Notes (Signed)
08/28/14-  82 yo M former smoker referred courtesy of Dr Felipa Eth for interstitial lung disease CXR 12/11/115- images reviewed with him IMPRESSION: Borderline cardiomegaly. Peripheral and bilateral basilar reticular interstitial prominence suspicious for chronic interstitial lung disease or fibrotic changes. No definite superimposed infiltrate or pulmonary edema. Electronically Signed  By: Lahoma Crocker M.D.  On: 07/11/2014 12:00 In the 1990s a chest x-ray showed "scar tissue" attributed to a bad pneumonia he had in the [employer] in the 1940s. Now, for the last 2 months, he has noticed dry cough with superimposed chest cold caught from his wife. Yellow sputum cleared. Still has morning cough productive of clear sputum and watery rhinorrhea. Not really short of breath but easier dyspnea on exertion and doesn't feel he can take as deep a breath as normal. In just the last few days he has noticed some wheeze. History of reflux symptoms after eating treated with Tums tablets. This may wake him occasionally but has not seemed severe. Easy choking while eating. Smoked a pipe in the mid 1980s. 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  09/22/14-documentation for 6 minute walk test 92%, 88%, 93%, 555 feet. Oxygen desaturation during this exercise with good recovery upon resting.  10/10/14-  56 yo M former smoker referred courtesy of Dr Felipa Eth for interstitial lung disease/ UIP FOLLOWS FOR: review PFT and 6MW test with patient. Here today with Mrs. Earney Hamburg nurse He describes having gotten over a cold but does not distinguish well. He apparently has at least morning cough most days with some clear phlegm or dry cough. He denies waking choking or aware of reflux. Bothersome rhinorrhea he says is actually his main complaint. Aware of dyspnea on exertion climbing stairs. 6 MWT-6 minute walk test 92%, 88%, 93%, 555 feet. Oxygen desaturation during this exercise with good  recovery upon resting. PFT 09/22/14- severe restriction, TLC 37%, DLCO33% CT chest HiRes 09/05/14 IMPRESSION: 1. The appearance of the lungs is compatible with interstitial lung disease, and the pattern is considered diagnostic of usual interstitial pneumonia (UIP) from an imaging standpoint. 2. Atherosclerosis, including 3 vessel coronary artery disease. 3. Additional incidental findings, as above. Electronically Signed  By: Vinnie Langton M.D.  On: 09/05/2014 14:24  12/11/14- 84 yo M former smoker referred courtesy of Dr Felipa Eth for interstitial lung disease/ UIP Follows For: Pt c/o SOB with activity, prod cough at times with thick white mucus. Uses 2L O2 at bedtime.  Here to discuss whether he wants to start Ofev Bothered by different contacts from insurance, our office, the home therapy company, and the patient financial support representative from the [place] ([employer]).  03/03/15- 38 yo M former smoker referred courtesy of Dr Felipa Eth for interstitial lung disease/ UIP OFEV started 11/2014 Did not get LFTs drawn 5/12 Follow For: Pt currently on OFEV 150mg  BID. No LFT since 09/2014. Tolerating med at current dose. Breathing is better since last visit. Wife recently diagnosed with metastatic colon cancer. Supporting her had been his major reason to start therapy for his UIP. Both have actually makes him feel better by using chronic constipation. Chest feels better. It used to be uncomfortable to take a deep breath but he feels normal again. Still dyspnea on exertion walking. Admits not much appetite and has lost 6 pounds from 163 pounds to 157 pounds since January. This began before OFEV.  07/03/2015-82 year old male former smoker followed for ILD/UIP OFEV reduced from twice daily to once daily because of GI distress FOLLOWS FOR: Pt continues taking OFEV  150mg  once daily. Pt states he has noticed fatigue and slight tickle in throat. O2 2L sleep/ [facility] OFEV was reduced from twice  to once daily because of nausea. Much better on once daily. Also added Prilosec and taking OFEV after meals. He asks about help with appetite and I gave permission to have a glass of wine before supper. So far liver functions have remained good. Therapist at home recorded oxygen saturation with activity between 84 and 88%. He sleeps with oxygen but does not have portable. Wife now declining with recurrent colon cancer/Hospice CXR 03/03/2015- reviewed with him IMPRESSION: Chronic stable pulmonary fibrotic changes. There is no alveolar pneumonia or pulmonary vascular congestion. Electronically Signed  By: David Martinique M.D.  On: 03/03/2015 14:17  08/18/2015-82 year old male former smoker followed for ILD/UIP OFEV twice daily after meals for GI tolerance, with Prilosec O2 2L sleep/ [facility] w LFT pending from Dr Felipa Eth FOLLOWS FOR: Pt states no reall differences in breathing since last visit; cough is little better. Pt uses O2 at night (was told to use during daytime while at home if needed). Will need ONO ordered to continue O2 from [employer] due to insurance. Tolerating OFEV well now twice daily as long as he takes it after meals. Little change in chronic cough.  ROS-see HPI Constitutional:   + weight loss, night sweats, fevers, chills, fatigue, lassitude. HEENT:   No-  headaches, +difficulty swallowing, tooth/dental problems, sore throat,       No-  sneezing, itching, ear ache, nasal congestion, +post nasal drip,  CV:  No-   chest pain, orthopnea, PND, swelling in lower extremities, anasarca,                                                             dizziness, palpitations Resp: + shortness of breath with exertion or at rest.              productive cough,   non-productive cough,  No- coughing up of blood.              No-   change in color of mucus.  wheezing.   Skin: No-   rash or lesions. GI:  +heartburn, indigestion, no-abdominal pain, nausea, vomiting,  GU: . MS:  No-    joint pain or swelling.  . Neuro-     nothing unusual Psych:  No- change in mood or affect. No depression or anxiety.  No memory loss.  OBJ- Physical Exam    Tall, slender, healthy appearing man    + Room Air Sat 93% at rest today General- Alert, Oriented, Affect-appropriate, Distress- none acute Skin- rash-none, lesions- none, excoriation- none Lymphadenopathy- none Head- atraumatic            Eyes- Gross vision intact, PERRLA, conjunctivae and secretions clear            Ears- Hearing, canals-normal            Nose- Clear, no-Septal dev, mucus, polyps, erosion, perforation             Throat- Mallampati II , mucosa clear , drainage- none, tonsils- atrophic Neck- flexible , trachea midline, no stridor , thyroid nl, carotid no bruit Chest - symmetrical excursion , unlabored           Heart/CV- RRR ,  no murmur , no gallop  , no rub, nl s1 s2                           - JVD- none , edema- none, stasis changes- none, varices- none           Lung- +  crackles minimal, wheeze- none, cough+ dry , dullness-none, rub- none           Chest wall-  Abd-  Br/ Gen/ Rectal- Not done, not indicated Extrem- cyanosis- none, clubbing- none, atrophy- none, strength- nl Neuro- grossly intact to observation

## 2015-08-19 DIAGNOSIS — R278 Other lack of coordination: Secondary | ICD-10-CM | POA: Diagnosis not present

## 2015-08-19 DIAGNOSIS — M6281 Muscle weakness (generalized): Secondary | ICD-10-CM | POA: Diagnosis not present

## 2015-08-21 DIAGNOSIS — R278 Other lack of coordination: Secondary | ICD-10-CM | POA: Diagnosis not present

## 2015-08-21 DIAGNOSIS — M6281 Muscle weakness (generalized): Secondary | ICD-10-CM | POA: Diagnosis not present

## 2015-08-24 DIAGNOSIS — L249 Irritant contact dermatitis, unspecified cause: Secondary | ICD-10-CM | POA: Diagnosis not present

## 2015-08-25 DIAGNOSIS — R278 Other lack of coordination: Secondary | ICD-10-CM | POA: Diagnosis not present

## 2015-08-25 DIAGNOSIS — M6281 Muscle weakness (generalized): Secondary | ICD-10-CM | POA: Diagnosis not present

## 2015-08-28 DIAGNOSIS — M6281 Muscle weakness (generalized): Secondary | ICD-10-CM | POA: Diagnosis not present

## 2015-08-28 DIAGNOSIS — R278 Other lack of coordination: Secondary | ICD-10-CM | POA: Diagnosis not present

## 2015-09-01 DIAGNOSIS — R278 Other lack of coordination: Secondary | ICD-10-CM | POA: Diagnosis not present

## 2015-09-01 DIAGNOSIS — M6281 Muscle weakness (generalized): Secondary | ICD-10-CM | POA: Diagnosis not present

## 2015-09-03 DIAGNOSIS — R278 Other lack of coordination: Secondary | ICD-10-CM | POA: Diagnosis not present

## 2015-09-03 DIAGNOSIS — M6281 Muscle weakness (generalized): Secondary | ICD-10-CM | POA: Diagnosis not present

## 2015-09-04 DIAGNOSIS — R278 Other lack of coordination: Secondary | ICD-10-CM | POA: Diagnosis not present

## 2015-09-04 DIAGNOSIS — M6281 Muscle weakness (generalized): Secondary | ICD-10-CM | POA: Diagnosis not present

## 2015-09-08 DIAGNOSIS — M6281 Muscle weakness (generalized): Secondary | ICD-10-CM | POA: Diagnosis not present

## 2015-09-08 DIAGNOSIS — R278 Other lack of coordination: Secondary | ICD-10-CM | POA: Diagnosis not present

## 2015-09-10 DIAGNOSIS — R278 Other lack of coordination: Secondary | ICD-10-CM | POA: Diagnosis not present

## 2015-09-10 DIAGNOSIS — M6281 Muscle weakness (generalized): Secondary | ICD-10-CM | POA: Diagnosis not present

## 2015-09-11 DIAGNOSIS — R278 Other lack of coordination: Secondary | ICD-10-CM | POA: Diagnosis not present

## 2015-09-11 DIAGNOSIS — M6281 Muscle weakness (generalized): Secondary | ICD-10-CM | POA: Diagnosis not present

## 2015-09-15 DIAGNOSIS — M6281 Muscle weakness (generalized): Secondary | ICD-10-CM | POA: Diagnosis not present

## 2015-09-15 DIAGNOSIS — R278 Other lack of coordination: Secondary | ICD-10-CM | POA: Diagnosis not present

## 2015-09-18 DIAGNOSIS — M6281 Muscle weakness (generalized): Secondary | ICD-10-CM | POA: Diagnosis not present

## 2015-09-18 DIAGNOSIS — R278 Other lack of coordination: Secondary | ICD-10-CM | POA: Diagnosis not present

## 2015-09-29 ENCOUNTER — Encounter: Payer: Self-pay | Admitting: Internal Medicine

## 2015-10-05 ENCOUNTER — Telehealth: Payer: Self-pay | Admitting: Internal Medicine

## 2015-10-05 NOTE — Telephone Encounter (Signed)
Forwarding to Joellen Jersey to look out for.

## 2015-10-05 NOTE — Telephone Encounter (Signed)
Forms with message placed on CY's cart.

## 2015-10-05 NOTE — Telephone Encounter (Signed)
Patient came in to drop off patience assistance form for Dr. Annamaria Boots to fill out. i have placed forms in CY folder up front. Please call patient at [phone] or [phone] when this is complete

## 2015-10-06 NOTE — Telephone Encounter (Signed)
Samuel Paul- are these forms done? Please advise.

## 2015-10-06 NOTE — Telephone Encounter (Signed)
Forms to be signed by CY in am and then will be faxed back.

## 2015-10-08 NOTE — Telephone Encounter (Signed)
Pt calling about an affidavit that he says he left for CY to sign please advise says he has to have it in soon and says he will come by to pick it up.Hillery Hunter

## 2015-10-08 NOTE — Telephone Encounter (Signed)
Forms have been completed and faxed back to [employer] at [phone]; patient is aware and will come by to pick up forms to have on hand in case he needs to reference them. I have made copy for our records and sent to scan. Nothing more needed at this time.

## 2015-10-08 NOTE — Telephone Encounter (Signed)
Dpne

## 2015-10-14 ENCOUNTER — Other Ambulatory Visit: Payer: Self-pay | Admitting: Internal Medicine

## 2015-10-27 DIAGNOSIS — I10 Essential (primary) hypertension: Secondary | ICD-10-CM | POA: Diagnosis not present

## 2015-10-27 DIAGNOSIS — L89152 Pressure ulcer of sacral region, stage 2: Secondary | ICD-10-CM | POA: Diagnosis not present

## 2015-10-27 DIAGNOSIS — Z7984 Long term (current) use of oral hypoglycemic drugs: Secondary | ICD-10-CM | POA: Diagnosis not present

## 2015-10-27 DIAGNOSIS — J8410 Pulmonary fibrosis, unspecified: Secondary | ICD-10-CM | POA: Diagnosis not present

## 2015-10-27 DIAGNOSIS — E1140 Type 2 diabetes mellitus with diabetic neuropathy, unspecified: Secondary | ICD-10-CM | POA: Diagnosis not present

## 2015-10-30 ENCOUNTER — Telehealth: Payer: Self-pay | Admitting: Internal Medicine

## 2015-10-30 NOTE — Telephone Encounter (Signed)
Spoke with pt. He feels he needs daytime oxygen. Pt currently uses oxygen at night time only. I have made him an appointment with CY on 11/03/15 at 9:45am. Nothing further was needed.

## 2015-11-02 ENCOUNTER — Ambulatory Visit: Payer: PRIVATE HEALTH INSURANCE | Admitting: Internal Medicine

## 2015-11-03 ENCOUNTER — Ambulatory Visit (INDEPENDENT_AMBULATORY_CARE_PROVIDER_SITE_OTHER): Payer: Medicare Other | Admitting: Internal Medicine

## 2015-11-03 ENCOUNTER — Encounter: Payer: Self-pay | Admitting: Internal Medicine

## 2015-11-03 VITALS — BP 132/68 | HR 80 | Ht 70.0 in | Wt 146.6 lb

## 2015-11-03 DIAGNOSIS — J849 Interstitial pulmonary disease, unspecified: Secondary | ICD-10-CM

## 2015-11-03 NOTE — Patient Instructions (Signed)
Order- DME [facility]- change O2 order to continuous and portable O2 2-3L/m. Evaluate for POC  Desaturation to 87% walking on room air 11/03/15 at this office     Dx UIP, Interstitial Lung Disease, Chronic hypoxic respiratory failure

## 2015-11-03 NOTE — Progress Notes (Signed)
08/28/14-  82 yo M former smoker referred courtesy of Dr Felipa Eth for interstitial lung disease CXR 12/11/115- images reviewed with him IMPRESSION: Borderline cardiomegaly. Peripheral and bilateral basilar reticular interstitial prominence suspicious for chronic interstitial lung disease or fibrotic changes. No definite superimposed infiltrate or pulmonary edema. Electronically Signed  By: Lahoma Crocker M.D.  On: 07/11/2014 12:00 In the 1990s a chest x-ray showed "scar tissue" attributed to a bad pneumonia he had in the [employer] in the 1940s. Now, for the last 2 months, he has noticed dry cough with superimposed chest cold caught from his wife. Yellow sputum cleared. Still has morning cough productive of clear sputum and watery rhinorrhea. Not really short of breath but easier dyspnea on exertion and doesn't feel he can take as deep a breath as normal. In just the last few days he has noticed some wheeze. History of reflux symptoms after eating treated with Tums tablets. This may wake him occasionally but has not seemed severe. Easy choking while eating. Smoked a pipe in the mid 1980s. 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  09/22/14-documentation for 6 minute walk test 92%, 88%, 93%, 555 feet. Oxygen desaturation during this exercise with good recovery upon resting.  10/10/14-  33 yo M former smoker referred courtesy of Dr Felipa Eth for interstitial lung disease/ UIP FOLLOWS FOR: review PFT and 6MW test with patient. Here today with Mrs. Earney Hamburg nurse He describes having gotten over a cold but does not distinguish well. He apparently has at least morning cough most days with some clear phlegm or dry cough. He denies waking choking or aware of reflux. Bothersome rhinorrhea he says is actually his main complaint. Aware of dyspnea on exertion climbing stairs. 6 MWT-6 minute walk test 92%, 88%, 93%, 555 feet. Oxygen desaturation during this exercise with good  recovery upon resting. PFT 09/22/14- severe restriction, TLC 37%, DLCO33% CT chest HiRes 09/05/14 IMPRESSION: 1. The appearance of the lungs is compatible with interstitial lung disease, and the pattern is considered diagnostic of usual interstitial pneumonia (UIP) from an imaging standpoint. 2. Atherosclerosis, including 3 vessel coronary artery disease. 3. Additional incidental findings, as above. Electronically Signed  By: Vinnie Langton M.D.  On: 09/05/2014 14:24  12/11/14- 60 yo M former smoker referred courtesy of Dr Felipa Eth for interstitial lung disease/ UIP Follows For: Pt c/o SOB with activity, prod cough at times with thick white mucus. Uses 2L O2 at bedtime.  Here to discuss whether he wants to start Ofev Bothered by different contacts from insurance, our office, the home therapy company, and the patient financial support representative from the [place] ([employer]).  03/03/15- 54 yo M former smoker referred courtesy of Dr Felipa Eth for interstitial lung disease/ UIP OFEV started 11/2014 Did not get LFTs drawn 5/12 Follow For: Pt currently on OFEV 150mg  BID. No LFT since 09/2014. Tolerating med at current dose. Breathing is better since last visit. Wife recently diagnosed with metastatic colon cancer. Supporting her had been his major reason to start therapy for his UIP. Both have actually makes him feel better by using chronic constipation. Chest feels better. It used to be uncomfortable to take a deep breath but he feels normal again. Still dyspnea on exertion walking. Admits not much appetite and has lost 6 pounds from 163 pounds to 157 pounds since January. This began before OFEV.  07/03/2015-82 year old male former smoker followed for ILD/UIP OFEV reduced from twice daily to once daily because of GI distress FOLLOWS FOR: Pt continues taking OFEV  150mg  once daily. Pt states he has noticed fatigue and slight tickle in throat. O2 2L sleep/ [facility] OFEV was reduced from twice  to once daily because of nausea. Much better on once daily. Also added Prilosec and taking OFEV after meals. He asks about help with appetite and I gave permission to have a glass of wine before supper. So far liver functions have remained good. Therapist at home recorded oxygen saturation with activity between 84 and 88%. He sleeps with oxygen but does not have portable. Wife now declining with recurrent colon cancer/Hospice CXR 03/03/2015- reviewed with him IMPRESSION: Chronic stable pulmonary fibrotic changes. There is no alveolar pneumonia or pulmonary vascular congestion. Electronically Signed  By: David Martinique M.D.  On: 03/03/2015 14:17  08/18/2015-82 year old male former smoker followed for ILD/UIP OFEV twice daily after meals for GI tolerance, with Prilosec O2 2L sleep/ [facility] w LFT pending from Dr Felipa Eth FOLLOWS FOR: Pt states no reall differences in breathing since last visit; cough is little better. Pt uses O2 at night (was told to use during daytime while at home if needed). Will need ONO ordered to continue O2 from [place] due to insurance. Tolerating OFEV well now twice daily as long as he takes it after meals. Little change in chronic cough.  11/03/2015-82 year old male former smoker followed for ILD/UIP O2 2 L sleep/[facility] OFEV 150 twice daily FOLLOWS FOR: Pt here to discuss O2 needs during the daytime as he is having SOB and low O2 levels. Currently on [facility] O2 through [place]. Desaturated to 87% walking on room air from waiting room to exam room today  CXR 08/18/2015 IMPRESSION: Grossly unchanged pulmonary fibrosis without evidence for superimposed pulmonary consolidation. Electronically Signed  By: Lovey Newcomer M.D.  On: 08/18/2015 11:14    ROS-see HPI Constitutional:   + weight loss, night sweats, fevers, chills, fatigue, lassitude. HEENT:   No-  headaches, +difficulty swallowing, tooth/dental problems, sore throat,       No-  sneezing, itching,  ear ache, nasal congestion, +post nasal drip,  CV:  No-   chest pain, orthopnea, PND, swelling in lower extremities, anasarca,                                                             dizziness, palpitations Resp: + shortness of breath with exertion or at rest.              productive cough,   non-productive cough,  No- coughing up of blood.              No-   change in color of mucus.  wheezing.   Skin: No-   rash or lesions. GI:  +heartburn, indigestion, no-abdominal pain, nausea, vomiting,  GU: . MS:  No-   joint pain or swelling.  . Neuro-     nothing unusual Psych:  No- change in mood or affect. No depression or anxiety.  No memory loss.  OBJ- Physical Exam    Tall, slender, healthy appearing man    + Room Air Sat 93% at rest today General- Alert, Oriented, Affect-appropriate, Distress- none acute Skin- rash-none, lesions- none, excoriation- none Lymphadenopathy- none Head- atraumatic            Eyes- Gross vision intact, PERRLA, conjunctivae and secretions clear  Ears- Hearing, canals-normal            Nose- Clear, no-Septal dev, mucus, polyps, erosion, perforation             Throat- Mallampati II , mucosa clear , drainage- none, tonsils- atrophic Neck- flexible , trachea midline, no stridor , thyroid nl, carotid no bruit Chest - symmetrical excursion , unlabored           Heart/CV- RRR , no murmur , no gallop  , no rub, nl s1 s2                           - JVD- none , edema- none, stasis changes- none, varices- none           Lung- +  crackles minimal, wheeze- none, cough+ dry , dullness-none, rub- none           Chest wall-  Abd-  Br/ Gen/ Rectal- Not done, not indicated Extrem- cyanosis- none, clubbing- none, atrophy- none, strength- nl Neuro- grossly intact to observation

## 2015-11-17 ENCOUNTER — Telehealth: Payer: Self-pay | Admitting: *Deleted

## 2015-11-17 NOTE — Telephone Encounter (Signed)
Called [facility] and initiated PA for Ofev 150mg  capsules. Approved until 11/16/2016. [id number] Pt ID: [id number]  [facility] informed at [phone] ext. [phone]

## 2015-11-23 ENCOUNTER — Other Ambulatory Visit: Payer: Self-pay | Admitting: Internal Medicine

## 2015-12-16 ENCOUNTER — Ambulatory Visit: Payer: PRIVATE HEALTH INSURANCE | Admitting: Internal Medicine

## 2015-12-17 ENCOUNTER — Emergency Department (HOSPITAL_COMMUNITY)
Admission: EM | Admit: 2015-12-17 | Discharge: 2015-12-17 | Disposition: A | Discharge status: Home / Self Care | Payer: Medicare Other | Attending: Emergency Medicine | Admitting: Emergency Medicine

## 2015-12-17 ENCOUNTER — Telehealth: Payer: Self-pay | Admitting: Internal Medicine

## 2015-12-17 DIAGNOSIS — L89311 Pressure ulcer of right buttock, stage 1: Secondary | ICD-10-CM | POA: Diagnosis not present

## 2015-12-17 DIAGNOSIS — R404 Transient alteration of awareness: Secondary | ICD-10-CM | POA: Diagnosis not present

## 2015-12-17 DIAGNOSIS — R630 Anorexia: Secondary | ICD-10-CM

## 2015-12-17 DIAGNOSIS — E1121 Type 2 diabetes mellitus with diabetic nephropathy: Secondary | ICD-10-CM | POA: Insufficient documentation

## 2015-12-17 DIAGNOSIS — Z87891 Personal history of nicotine dependence: Secondary | ICD-10-CM | POA: Insufficient documentation

## 2015-12-17 DIAGNOSIS — Z79899 Other long term (current) drug therapy: Secondary | ICD-10-CM | POA: Insufficient documentation

## 2015-12-17 DIAGNOSIS — L8990 Pressure ulcer of unspecified site, unspecified stage: Secondary | ICD-10-CM

## 2015-12-17 DIAGNOSIS — L89321 Pressure ulcer of left buttock, stage 1: Secondary | ICD-10-CM | POA: Insufficient documentation

## 2015-12-17 DIAGNOSIS — I10 Essential (primary) hypertension: Secondary | ICD-10-CM | POA: Insufficient documentation

## 2015-12-17 DIAGNOSIS — R531 Weakness: Secondary | ICD-10-CM | POA: Diagnosis not present

## 2015-12-17 DIAGNOSIS — K5790 Diverticulosis of intestine, part unspecified, without perforation or abscess without bleeding: Secondary | ICD-10-CM | POA: Diagnosis not present

## 2015-12-17 DIAGNOSIS — R5383 Other fatigue: Secondary | ICD-10-CM

## 2015-12-17 DIAGNOSIS — Z7984 Long term (current) use of oral hypoglycemic drugs: Secondary | ICD-10-CM | POA: Diagnosis not present

## 2015-12-17 DIAGNOSIS — R109 Unspecified abdominal pain: Secondary | ICD-10-CM | POA: Diagnosis not present

## 2015-12-17 LAB — URINALYSIS, ROUTINE W REFLEX MICROSCOPIC
Glucose, UA: NEGATIVE mg/dL
HGB URINE DIPSTICK: NEGATIVE
KETONES UR: NEGATIVE mg/dL
Leukocytes, UA: NEGATIVE
NITRITE: NEGATIVE
PH: 5.5 (ref 5.0–8.0)
Protein, ur: NEGATIVE mg/dL
SPECIFIC GRAVITY, URINE: 1.023 (ref 1.005–1.030)

## 2015-12-17 LAB — BASIC METABOLIC PANEL
Anion gap: 7 (ref 5–15)
BUN: 14 mg/dL (ref 6–20)
CALCIUM: 9.7 mg/dL (ref 8.9–10.3)
CO2: 33 mmol/L — AB (ref 22–32)
Chloride: 100 mmol/L — ABNORMAL LOW (ref 101–111)
Creatinine, Ser: 0.77 mg/dL (ref 0.61–1.24)
GFR calc Af Amer: >60 mL/min (ref >60)
GLUCOSE: 75 mg/dL (ref 65–99)
Potassium: 4.7 mmol/L (ref 3.5–5.1)
Sodium: 140 mmol/L (ref 135–145)

## 2015-12-17 LAB — CBC
HCT: 43 % (ref 39.0–52.0)
HEMOGLOBIN: 14.4 g/dL (ref 13.0–17.0)
MCH: 32.4 pg (ref 26.0–34.0)
MCHC: 33.5 g/dL (ref 30.0–36.0)
MCV: 96.8 fL (ref 78.0–100.0)
Platelets: 254 10*3/uL (ref 150–400)
RBC: 4.44 MIL/uL (ref 4.22–5.81)
RDW: 13 % (ref 11.5–15.5)
WBC: 10.7 10*3/uL — ABNORMAL HIGH (ref 4.0–10.5)

## 2015-12-17 NOTE — Telephone Encounter (Signed)
Spoke with pt and his daughter, Samuel Paul. .pt c/o worsening decrease in appetite, weight loss, nausea and possible dehydration.  Pt thinks this may be from the [facility] which he has been on for at least a year now.  Pt's PCP could not see him today.  He is now at an urgent care and was told that it would be a 2 hr wait.  He is asking if Dr Annamaria Boots has any recommendations.  Please advise.  Allergies  Allergen Reactions  . Actonel  [Risedronate Sodium]   . Fosamax  [Alendronate Sodium]   . Hctz  [Hydrochlorothiazide]   . Other     Dye during Gallbladder procedure    (BSP dye)  . Tetanus-Diphth-Acell Pertussis     Current Outpatient Prescriptions on File Prior to Visit  Medication Sig Dispense Refill  . benazepril (LOTENSIN) 10 MG tablet Take 10 mg by mouth daily.    Marland Kitchen glipiZIDE (GLUCOTROL) 5 MG tablet Take 2 tablets in AM and 1 tablet in PM    . Glucosamine-Chondroit-Vit C-Mn (GLUCOSAMINE CHONDR 1500 COMPLX PO) Take by mouth.    Marland Kitchen ipratropium (ATROVENT) 0.06 % nasal spray INSTILL 1-2 SPRAYS IN EACH NOSTRIL 3 TIMES DAILY AS NEEDED. 15 mL 0  . metFORMIN (GLUCOPHAGE) 500 MG tablet Take 500 mg by mouth 2 (two) times daily. Take 500mg  by mouth with breakfast Take 500mg  by mouth with dinner    . Multiple Vitamin (MULTIVITAMIN) tablet Take 1 tablet by mouth daily.    . Nintedanib Esylate (OFEV) 150 MG CAPS 1 every 12 hours with food 30 capsule 2  . omeprazole (PRILOSEC) 20 MG capsule Take 20 mg by mouth daily as needed.      No current facility-administered medications on file prior to visit.

## 2015-12-17 NOTE — Discharge Instructions (Signed)
Pressure Injury °A pressure injury, sometimes called a bedsore, is an injury to the skin and underlying tissue caused by pressure. Pressure on blood vessels causes decreased blood flow to the skin, which can eventually cause the skin tissue to die and break down into a wound. °Pressure injuries usually occur: °· Over bony parts of the body such as the tailbone, shoulders, elbows, hips, and heels. °· Under medical devices such as respiratory equipment, stockings, tubes, and splints. °Pressure injuries start as reddened areas on the skin and can lead to pain, muscle damage, and infection. Pressure injuries can vary in severity.  °CAUSES °Pressure injuries are caused by a lack of blood supply to an area of skin. They can occur from intense pressure over a short period of time or from less intense pressure over a long period of time. °RISK FACTORS °This condition is more likely to develop in people who: °· Are in the hospital or an extended care facility. °· Are bedridden or in a wheelchair. °· Have an injury or disease that keeps them from: °¨ Moving normally. °¨ Feeling pain or pressure. °· Have a condition that: °¨ Makes them sleepy or less alert. °¨ Causes poor blood flow. °· Need to wear a medical device. °· Have poor control of their bladder or bowel functions (incontinence). °· Have poor nutrition (malnutrition). °· Are of certain ethnicities. People of African American and Latino or Hispanic descent are at higher risk compared to other ethnic groups. °If you are at risk for pressure ulcers, your health care provider may recommend certain types of bedding to help prevent them. These may include foam or gel mattresses covered with one of the following: °· A sheepskin blanket. °· A pad that is filled with gel, air, water, or foam. °SYMPTOMS  °The main symptom is a blister or change in skin color that opens into a wound. Other symptoms include:  °· Red or dark areas of skin that do not turn white or pale when  pressed with a finger.   °· Pain, warmth, or change of skin texture.   °DIAGNOSIS °This condition is diagnosed with a medical history and physical exam. You may also have tests, including:  °· Blood tests to check for infection or signs of poor nutrition. °· Imaging studies to check for damage to the deep tissues under your skin. °· Blood flow studies. °Your pressure injury will be staged to determine its severity. Staging is an assessment of: °· The depth of the pressure injury. °· Which tissues are exposed because of the pressure injury. °· The causes of the pressure injury.  °TREATMENT °The main focus of treatment is to help your injury heal. This may be done by:  °· Relieving or redistributing pressure on your skin. This includes: °¨ Frequently changing your position. °¨ Eliminating or minimizing positions that caused the wound or that can make the wound worse. °¨ Using specific bed mattresses and chair cushions. °¨ Refitting, resizing, or replacing any medical devices, or padding the skin under them. °¨ Using creams or powders to prevent rubbing (friction) on the skin. °· Keeping your skin clean and dry. This may include using a skin cleanser or skin protectant as told by your health care provider. This may be a lotion, ointment, or spray. °· Cleaning your injury and removing any dead tissue from the wound (debridement). °· Placing a bandage (dressing) over your injury. °· Preventing or treating infection. This may include antibiotic, antimicrobial, or antiseptic medicines. °Treatment may also include medicine for pain. °  Sometimes surgery is needed to close the wound with a flap of healthy skin or a piece of skin from another area of your body (graft). You may need surgery if other treatments are not working or if your injury is very deep. HOME CARE INSTRUCTIONS Wound Care  Follow instructions from your health care provider about:  How to take care of your wound.  When and how you should change your  dressing.  When you should remove your dressing. If your dressing is dry and stuck when you try to remove it, moisten or wet the dressing with saline or water so that it can be removed without harming your skin or wound tissue.  Check your wound every day for signs of infection. Have a caregiver do this for you if you are not able. Watch for:  More redness, swelling, or pain.  More fluid, blood, or pus.  A bad smell. Skin Care  Keep your skin clean and dry. Gently pat your skin dry.  Do not rub or massage your skin.  Use a skin protectant only as told by your health care provider.  Check your skin every day for any changes in color or any new blisters or sores (ulcers). Have a caregiver do this for you if you are not able. Medicines  Take over-the-counter and prescription medicines only as told by your health care provider.  If you were prescribed an antibiotic medicine, take it or apply it as told by your health care provider. Do not stop taking or using the antibiotic even if your condition improves. Reducing and Redistributing Pressure  Do not lie or sit in one position for a long time. Move or change position every two hours or as told by your health care provider.  Use pillows or cushions to reduce pressure. Ask your health care provider to recommend cushions or pads for you.  Use medical devices that do not rub your skin. Tell your health care provider if one of your medical devices is causing a pressure injury to develop. General Instructions  Eat a healthy diet that includes lots of protein. Ask your health care provider for diet advice.  Drink enough fluid to keep your urine clear or pale yellow.  Be as active as you can every day. Ask your health care provider to suggest safe exercises or activities.  Do not abuse drugs or alcohol.  Keep all follow-up visits as told by your health care provider. This is important.  Do not smoke. SEEK MEDICAL CARE IF:  You  have chills or fever.  Your pain medicine is not helping.  You have any changes in skin color.  You have new blisters or sores.  You develop warmth, redness, or swelling near a pressure injury.  You have a bad odor or pus coming from your pressure injury.  You lose control of your bowels or bladder.  You develop new symptoms.  Your wound does not improve after 1-2 weeks of treatment.  You develop a new medical condition, such as diabetes, peripheral vascular disease, or conditions that affect your defense (immune) system.   This information is not intended to replace advice given to you by your health care provider. Make sure you discuss any questions you have with your health care provider.   Document Released: 07/18/2005 Document Revised: 04/08/2015 Document Reviewed: 11/26/2014 Elsevier Interactive Patient Education 2016 [employer]. Fatigue Fatigue is feeling tired all of the time, a lack of energy, or a lack of motivation. Occasional or  mild fatigue is often a normal response to activity or life in general. However, long-lasting (chronic) or extreme fatigue may indicate an underlying medical condition. HOME CARE INSTRUCTIONS  Watch your fatigue for any changes. The following actions may help to lessen any discomfort you are feeling:  Talk to your health care provider about how much sleep you need each night. Try to get the required amount every night.  Take medicines only as directed by your health care provider.  Eat a healthy and nutritious diet. Ask your health care provider if you need help changing your diet.  Drink enough fluid to keep your urine clear or pale yellow.  Practice ways of relaxing, such as yoga, meditation, massage therapy, or acupuncture.  Exercise regularly.   Change situations that cause you stress. Try to keep your work and personal routine reasonable.  Do not abuse illegal drugs.  Limit alcohol intake to no more than 1 drink per day for  nonpregnant women and 2 drinks per day for men. One drink equals 12 ounces of beer, 5 ounces of wine, or 1 ounces of hard liquor.  Take a multivitamin, if directed by your health care provider. SEEK MEDICAL CARE IF:   Your fatigue does not get better.  You have a fever.   You have unintentional weight loss or gain.  You have headaches.   You have difficulty:   Falling asleep.  Sleeping throughout the night.  You feel angry, guilty, anxious, or sad.   You are unable to have a bowel movement (constipation).   You skin is dry.   Your legs or another part of your body is swollen.  SEEK IMMEDIATE MEDICAL CARE IF:   You feel confused.   Your vision is blurry.  You feel faint or pass out.   You have a severe headache.   You have severe abdominal, pelvic, or back pain.   You have chest pain, shortness of breath, or an irregular or fast heartbeat.   You are unable to urinate or you urinate less than normal.   You develop abnormal bleeding, such as bleeding from the rectum, vagina, nose, lungs, or nipples.  You vomit blood.   You have thoughts about harming yourself or committing suicide.   You are worried that you might harm someone else.    This information is not intended to replace advice given to you by your health care provider. Make sure you discuss any questions you have with your health care provider.   Document Released: 05/15/2007 Document Revised: 08/08/2014 Document Reviewed: 11/19/2013 Elsevier Interactive Patient Education [employer].

## 2015-12-17 NOTE — Telephone Encounter (Signed)
Recommend stop OFEV for now. Hopefully he will start to feel better.

## 2015-12-17 NOTE — Telephone Encounter (Signed)
Spoke with the pt and notified of recs per CDY  He verbalized understanding and nothing further needed 

## 2015-12-17 NOTE — ED Provider Notes (Signed)
CSN: [id number]     Arrival date & time 12/17/15  1625 History   First MD Initiated Contact with Patient 12/17/15 1934     Chief Complaint  Patient presents with  . Fatigue     (Consider location/radiation/quality/duration/timing/severity/associated sxs/prior Treatment) HPI Patient has had appetite loss and general fatigue for several months. He thought it might be due to his pulmonary fibrosis medication. Patient had no fevers, no chills. He does report yesterday he felt very badly. Today he feels much better again. He has had some ongoing nausea. Patient is restarted Prilosec about 2 or 3 days ago. He had been off of a several months. His also contacted his pulmonologist has suggested he discontinue the pulmonary fibrosis medication, OFEV. No vomiting or diarrhea. No increase in coughing her baseline shortness of breath. No chest pain. Patient has been developing a pressure sore on his buttock's for a number of months. It is not specifically worse but he reports is not improving either. Patient's wife died approximately 2 weeks ago. He had been sitting at her bedside for the months leading up to her death. Past Medical History  Diagnosis Date  . DM (diabetes mellitus) ([place])   . Peptic ulcer   . GERD (gastroesophageal reflux disease)   . Hypertension   . Hypercholesterolemia   . Peripheral neuropathy ([place])   . Diverticulosis   . Shingles    No past surgical history on file. Family History  Problem Relation Age of Onset  . Heart disease Brother   . Heart disease Brother   . Rheum arthritis Mother   . Rheum arthritis Father    Social History  Substance Use Topics  . Smoking status: Former Smoker    Types: Pipe    Quit date: 08/02/1983  . Smokeless tobacco: Not on file     Comment: smoked pipe for around 50 years  . Alcohol Use: 0.0 oz/week    0 Standard drinks or equivalent per week     Comment: occasionally    Review of Systems 10 Systems reviewed and are negative for  acute change except as noted in the HPI.    Allergies  Actonel ; Fosamax ; Hctz ; Other; and Tetanus-diphth-acell pertussis  Home Medications   Prior to Admission medications   Medication Sig Start Date End Date Taking? Authorizing Provider  benazepril (LOTENSIN) 10 MG tablet Take 10 mg by mouth daily.   Yes Historical Provider, MD  glipiZIDE (GLUCOTROL) 5 MG tablet Take 2.5-5 mg by mouth 2 (two) times daily before a meal. Take 2 tablets (5 mg) in the am and Take 1 tablet (2.5 mg) at night.   Yes Historical Provider, MD  ipratropium (ATROVENT) 0.06 % nasal spray INSTILL 1-2 SPRAYS IN EACH NOSTRIL 3 TIMES DAILY AS NEEDED. 11/24/15  Yes Deneise Lever, MD  metFORMIN (GLUCOPHAGE) 500 MG tablet Take 500 mg by mouth 2 (two) times daily.    Yes Historical Provider, MD  Multiple Vitamin (MULTIVITAMIN) tablet Take 1 tablet by mouth daily.   Yes Historical Provider, MD  Nintedanib Esylate (OFEV) 150 MG CAPS 1 every 12 hours with food 10/17/14  Yes Deneise Lever, MD  omeprazole (PRILOSEC) 20 MG capsule Take 20 mg by mouth daily as needed (indigestion).    Yes Historical Provider, MD   BP 159/102 mmHg  Pulse 78  Temp(Src) 97.6 F (36.4 C) (Oral)  Resp 18  SpO2 94% Physical Exam  Constitutional: He is oriented to person, place, and time. He appears well-developed  and well-nourished.  HENT:  Head: Normocephalic and atraumatic.  Eyes: EOM are normal. No scleral icterus.  Neck: Neck supple.  Cardiovascular: Normal rate, regular rhythm and intact distal pulses.   Frequent PVCs.   Pulmonary/Chest:  Occasional fine basilar rail with good airflow.  Abdominal: Soft. Bowel sounds are normal. He exhibits no distension. There is no tenderness.  Musculoskeletal: Normal range of motion. He exhibits no edema or tenderness.  Neurological: He is alert and oriented to person, place, and time. He has normal strength. No cranial nerve deficit. He exhibits normal muscle tone. Coordination normal. GCS eye  subscore is 4. GCS verbal subscore is 5. GCS motor subscore is 6.  Skin: Skin is warm, dry and intact.  The patient's buttocks bilaterally he has stage I decubiti. Skin surfaces are erythematous but intact aside from one very shallow erosion in the epidermis that is 1 cm and round.  Psychiatric: He has a normal mood and affect.    ED Course  Procedures (including critical care time) Labs Review Labs Reviewed  BASIC METABOLIC PANEL - Abnormal; Notable for the following:    Chloride 100 (*)    CO2 33 (*)    All other components within normal limits  CBC - Abnormal; Notable for the following:    WBC 10.7 (*)    All other components within normal limits  URINALYSIS, ROUTINE W REFLEX MICROSCOPIC (NOT AT [facility]) - Abnormal; Notable for the following:    Color, Urine AMBER (*)    Bilirubin Urine SMALL (*)    All other components within normal limits    Imaging Review No results found. I have personally reviewed and evaluated these images and lab results as part of my medical decision-making.   EKG Interpretation None      MDM   Final diagnoses:  Other fatigue  Anorexia  Decubitus ulcer   Clinically, patient has very well appearance. He is alert and nontoxic. Aside from PVCs and a stage I decubitus, the patient's physical examination is excellent for age. At this time, no indication of significant abnormality in labs to suggest dehydration, anemia or acute infection. Patient will be stopping OFEV per his pulmonologist recommendation. He is also just restart her Prilosec which I feel is appropriate. We also discussed possibility of depression fact ring in 2 anorexia and fatigue given the recent death of his wife. Patient is however alert and in good spirits and here with family members. I feel he is safe to continue outpatient management with recommendations to go to wound care clinic and follow-up with his PCP.    Charlesetta Shanks, MD 12/17/15 2105

## 2015-12-17 NOTE — ED Notes (Addendum)
Per EMS. Pt from [facility] for eval. Pt reports generalized weakness and nausea for the past 4 months, worse last night. Told PCP he had abd pain, but denied with EMS. There was a recent death in the family. Pt on 2 L [place] at home.

## 2015-12-17 NOTE — ED Notes (Signed)
Duoderm like dressing applied to decubitus on left buttock.

## 2015-12-31 ENCOUNTER — Other Ambulatory Visit: Payer: Self-pay | Admitting: Internal Medicine

## 2016-01-11 DIAGNOSIS — R2681 Unsteadiness on feet: Secondary | ICD-10-CM | POA: Diagnosis not present

## 2016-01-11 DIAGNOSIS — M6281 Muscle weakness (generalized): Secondary | ICD-10-CM | POA: Diagnosis not present

## 2016-01-11 DIAGNOSIS — R262 Difficulty in walking, not elsewhere classified: Secondary | ICD-10-CM | POA: Diagnosis not present

## 2016-01-11 DIAGNOSIS — R278 Other lack of coordination: Secondary | ICD-10-CM | POA: Diagnosis not present

## 2016-01-14 DIAGNOSIS — R262 Difficulty in walking, not elsewhere classified: Secondary | ICD-10-CM | POA: Diagnosis not present

## 2016-01-14 DIAGNOSIS — R2681 Unsteadiness on feet: Secondary | ICD-10-CM | POA: Diagnosis not present

## 2016-01-14 DIAGNOSIS — R278 Other lack of coordination: Secondary | ICD-10-CM | POA: Diagnosis not present

## 2016-01-14 DIAGNOSIS — M6281 Muscle weakness (generalized): Secondary | ICD-10-CM | POA: Diagnosis not present

## 2016-01-18 DIAGNOSIS — R2681 Unsteadiness on feet: Secondary | ICD-10-CM | POA: Diagnosis not present

## 2016-01-18 DIAGNOSIS — M6281 Muscle weakness (generalized): Secondary | ICD-10-CM | POA: Diagnosis not present

## 2016-01-18 DIAGNOSIS — R278 Other lack of coordination: Secondary | ICD-10-CM | POA: Diagnosis not present

## 2016-01-18 DIAGNOSIS — R262 Difficulty in walking, not elsewhere classified: Secondary | ICD-10-CM | POA: Diagnosis not present

## 2016-01-20 DIAGNOSIS — R278 Other lack of coordination: Secondary | ICD-10-CM | POA: Diagnosis not present

## 2016-01-20 DIAGNOSIS — R2681 Unsteadiness on feet: Secondary | ICD-10-CM | POA: Diagnosis not present

## 2016-01-20 DIAGNOSIS — M6281 Muscle weakness (generalized): Secondary | ICD-10-CM | POA: Diagnosis not present

## 2016-01-20 DIAGNOSIS — R262 Difficulty in walking, not elsewhere classified: Secondary | ICD-10-CM | POA: Diagnosis not present

## 2016-01-21 DIAGNOSIS — R2681 Unsteadiness on feet: Secondary | ICD-10-CM | POA: Diagnosis not present

## 2016-01-21 DIAGNOSIS — M6281 Muscle weakness (generalized): Secondary | ICD-10-CM | POA: Diagnosis not present

## 2016-01-21 DIAGNOSIS — R278 Other lack of coordination: Secondary | ICD-10-CM | POA: Diagnosis not present

## 2016-01-21 DIAGNOSIS — R262 Difficulty in walking, not elsewhere classified: Secondary | ICD-10-CM | POA: Diagnosis not present

## 2016-01-25 ENCOUNTER — Telehealth: Payer: Self-pay | Admitting: Internal Medicine

## 2016-01-25 NOTE — Telephone Encounter (Signed)
Called and spoke with the pt  He states that [employer] delivered Pettis 2 wks ago and it is not working  He states that he was advised by them that they would send him a replacement, but he has not heard back from them  I called and was placed on hold for 5 min [facility] tomorrow

## 2016-01-26 DIAGNOSIS — E1140 Type 2 diabetes mellitus with diabetic neuropathy, unspecified: Secondary | ICD-10-CM | POA: Diagnosis not present

## 2016-01-26 DIAGNOSIS — I10 Essential (primary) hypertension: Secondary | ICD-10-CM | POA: Diagnosis not present

## 2016-01-26 DIAGNOSIS — Z79899 Other long term (current) drug therapy: Secondary | ICD-10-CM | POA: Diagnosis not present

## 2016-01-26 DIAGNOSIS — Z7984 Long term (current) use of oral hypoglycemic drugs: Secondary | ICD-10-CM | POA: Diagnosis not present

## 2016-01-26 DIAGNOSIS — J8410 Pulmonary fibrosis, unspecified: Secondary | ICD-10-CM | POA: Diagnosis not present

## 2016-01-26 NOTE — Telephone Encounter (Signed)
LVM with Freda Munro. She states that she will have [facility] return our call.

## 2016-01-27 DIAGNOSIS — R262 Difficulty in walking, not elsewhere classified: Secondary | ICD-10-CM | POA: Diagnosis not present

## 2016-01-27 DIAGNOSIS — M6281 Muscle weakness (generalized): Secondary | ICD-10-CM | POA: Diagnosis not present

## 2016-01-27 DIAGNOSIS — R2681 Unsteadiness on feet: Secondary | ICD-10-CM | POA: Diagnosis not present

## 2016-01-27 DIAGNOSIS — R278 Other lack of coordination: Secondary | ICD-10-CM | POA: Diagnosis not present

## 2016-01-28 ENCOUNTER — Other Ambulatory Visit: Payer: Self-pay | Admitting: Internal Medicine

## 2016-01-28 DIAGNOSIS — R278 Other lack of coordination: Secondary | ICD-10-CM | POA: Diagnosis not present

## 2016-01-28 DIAGNOSIS — M6281 Muscle weakness (generalized): Secondary | ICD-10-CM | POA: Diagnosis not present

## 2016-01-28 DIAGNOSIS — R262 Difficulty in walking, not elsewhere classified: Secondary | ICD-10-CM | POA: Diagnosis not present

## 2016-01-28 DIAGNOSIS — R2681 Unsteadiness on feet: Secondary | ICD-10-CM | POA: Diagnosis not present

## 2016-01-28 NOTE — Telephone Encounter (Signed)
LVM with Hoyle Sauer for [facility] to return call.

## 2016-01-28 NOTE — Telephone Encounter (Signed)
Called spoke with pt. She states that the POC has been sent in for repair and that they are waiting on the replacement to come in from [place] . Once the replacement arrives they will contact the patient to make sure he receives it.   Called spoke with pt. I informed him of the above conversation. He voiced understanding and had no further questions.

## 2016-01-28 NOTE — Telephone Encounter (Signed)
Rodena Piety from [facility] returning call [phone] please advise.Hillery Hunter

## 2016-01-29 ENCOUNTER — Other Ambulatory Visit: Payer: Self-pay | Admitting: Internal Medicine

## 2016-01-29 DIAGNOSIS — R278 Other lack of coordination: Secondary | ICD-10-CM | POA: Diagnosis not present

## 2016-01-29 DIAGNOSIS — R2681 Unsteadiness on feet: Secondary | ICD-10-CM | POA: Diagnosis not present

## 2016-01-29 DIAGNOSIS — R262 Difficulty in walking, not elsewhere classified: Secondary | ICD-10-CM | POA: Diagnosis not present

## 2016-01-29 DIAGNOSIS — M6281 Muscle weakness (generalized): Secondary | ICD-10-CM | POA: Diagnosis not present

## 2016-02-01 DIAGNOSIS — R262 Difficulty in walking, not elsewhere classified: Secondary | ICD-10-CM | POA: Diagnosis not present

## 2016-02-01 DIAGNOSIS — R2681 Unsteadiness on feet: Secondary | ICD-10-CM | POA: Diagnosis not present

## 2016-02-01 DIAGNOSIS — R278 Other lack of coordination: Secondary | ICD-10-CM | POA: Diagnosis not present

## 2016-02-01 DIAGNOSIS — M6281 Muscle weakness (generalized): Secondary | ICD-10-CM | POA: Diagnosis not present

## 2016-03-03 DIAGNOSIS — R278 Other lack of coordination: Secondary | ICD-10-CM | POA: Diagnosis not present

## 2016-03-03 DIAGNOSIS — R2681 Unsteadiness on feet: Secondary | ICD-10-CM | POA: Diagnosis not present

## 2016-03-03 DIAGNOSIS — R262 Difficulty in walking, not elsewhere classified: Secondary | ICD-10-CM | POA: Diagnosis not present

## 2016-03-03 DIAGNOSIS — M6281 Muscle weakness (generalized): Secondary | ICD-10-CM | POA: Diagnosis not present

## 2016-03-07 ENCOUNTER — Ambulatory Visit (INDEPENDENT_AMBULATORY_CARE_PROVIDER_SITE_OTHER): Payer: Medicare Other | Admitting: Internal Medicine

## 2016-03-07 ENCOUNTER — Ambulatory Visit (INDEPENDENT_AMBULATORY_CARE_PROVIDER_SITE_OTHER)
Admission: RE | Admit: 2016-03-07 | Discharge: 2016-03-07 | Disposition: A | Discharge status: Home / Self Care | Payer: Medicare Other | Source: Ambulatory Visit | Attending: Internal Medicine | Admitting: Internal Medicine

## 2016-03-07 ENCOUNTER — Encounter: Payer: Self-pay | Admitting: Internal Medicine

## 2016-03-07 VITALS — BP 118/72 | HR 90 | Ht 70.0 in | Wt 132.4 lb

## 2016-03-07 DIAGNOSIS — J849 Interstitial pulmonary disease, unspecified: Secondary | ICD-10-CM

## 2016-03-07 DIAGNOSIS — J9611 Chronic respiratory failure with hypoxia: Secondary | ICD-10-CM | POA: Diagnosis not present

## 2016-03-07 DIAGNOSIS — I10 Essential (primary) hypertension: Secondary | ICD-10-CM | POA: Diagnosis not present

## 2016-03-07 DIAGNOSIS — J84112 Idiopathic pulmonary fibrosis: Secondary | ICD-10-CM | POA: Diagnosis not present

## 2016-03-07 DIAGNOSIS — J8410 Pulmonary fibrosis, unspecified: Secondary | ICD-10-CM

## 2016-03-07 NOTE — Progress Notes (Signed)
08/28/14-   M former smoker referred courtesy of Dr Felipa Eth for interstitial lung disease .Marland Kitchen 6 MWT-6 minute walk test 92%, 88%, 93%, 555 feet. Oxygen desaturation during this exercise with good recovery upon resting. PFT 09/22/14- severe restriction, TLC 37%, DLCO33% CT chest HiRes 09/05/14 IMPRESSION: 1. The appearance of the lungs is compatible with interstitial lung disease, and the pattern is considered diagnostic of usual interstitial pneumonia (UIP) from an imaging standpoint. 2. Atherosclerosis, including 3 vessel coronary artery disease. 3. Additional incidental findings, as above. Electronically Signed  By: Vinnie Langton M.D.  On: 09/05/2014  08/18/2015-82 year old male former smoker followed for ILD/UIP OFEV twice daily after meals for GI tolerance, with Prilosec O2 2L sleep/ [facility] w LFT pending from Dr Felipa Eth FOLLOWS FOR: Pt states no reall differences in breathing since last visit; cough is little better. Pt uses O2 at night (was told to use during daytime while at home if needed). Will need ONO ordered to continue O2 from [place] due to insurance. Tolerating OFEV well now twice daily as long as he takes it after meals. Little change in chronic cough.  11/03/2015-82 year old male former smoker followed for ILD/UIP O2 2 L sleep/[facility] OFEV 150 twice daily FOLLOWS FOR: Pt here to discuss O2 needs during the daytime as he is having SOB and low O2 levels. Currently on QHS O2 through [place]. Desaturated to 87% walking on room air from waiting room to exam room today CXR 08/18/2015 IMPRESSION: Grossly unchanged pulmonary fibrosis without evidence for superimposed pulmonary consolidation. Electronically Signed  By: Lovey Newcomer M.D.  On: 08/18/2015 11:14  03/07/2016-82 year old male retired [employer], former smoker, followed for ILD/UIP O2 2 L /[facility] FOLLOWS FOR: Pt states his breathing is doing fairly good overall; he can tell a difference. Continues to  wear O2 24/7 through [facility] OFEV stopped 5/18 for malaise/ nausea requiring UC visit. He recognizes gradually worsening dyspnea with exertion and more consistent need for home oxygen now. More persistent dry cough. He is interested in considering Esbriet trial, having failed to tolerate Ofev. Addendum-he called back, having decided he did not want to start Esbriet  ROS-see HPI Constitutional:   + weight loss, night sweats, fevers, chills, fatigue, lassitude. HEENT:   No-  headaches, +difficulty swallowing, tooth/dental problems, sore throat,       No-  sneezing, itching, ear ache, nasal congestion, +post nasal drip,  CV:  No-   chest pain, orthopnea, PND, swelling in lower extremities, anasarca,                                                             dizziness, palpitations Resp: + shortness of breath with exertion or at rest.              productive cough,   + non-productive cough,  No- coughing up of blood.              No-   change in color of mucus.  wheezing.   Skin: No-   rash or lesions. GI:  heartburn, indigestion, no-abdominal pain, nausea, vomiting,  GU: . MS:  No-   joint pain or swelling.  . Neuro-     nothing unusual Psych:  No- change in mood or affect. No depression or anxiety.  No memory loss.  OBJ- Physical Exam  Tall, slender, healthy appearing man   O2 2 L  General- Alert, Oriented, Affect-appropriate, Distress- none acute Skin- rash-none, lesions- none, excoriation- none Lymphadenopathy- none Head- atraumatic            Eyes- Gross vision intact, PERRLA, conjunctivae and secretions clear            Ears- Hearing, canals-normal            Nose- Clear, no-Septal dev, mucus, polyps, erosion, perforation             Throat- Mallampati II , mucosa clear , drainage- none, tonsils- atrophic Neck- flexible , trachea midline, no stridor , thyroid nl, carotid no bruit Chest - symmetrical excursion , unlabored           Heart/CV- RRR , no murmur , no gallop  , no  rub, nl s1 s2                           - JVD- none , edema- none, stasis changes- none, varices- none           Lung- +  crackles lower third, wheeze- none, cough+ dry , dullness-none, rub- none           Chest wall-  Abd-  Br/ Gen/ Rectal- Not done, not indicated Extrem- cyanosis- none, clubbing- none, atrophy- none, strength- nl Neuro- grossly intact to observation

## 2016-03-07 NOTE — Patient Instructions (Addendum)
Order- CXR- dx UIP/ Interstitial lung disease  We will consider whether to try the alternative medicine Esbriet- declined by patient

## 2016-03-08 ENCOUNTER — Telehealth: Payer: Self-pay | Admitting: Internal Medicine

## 2016-03-08 DIAGNOSIS — R278 Other lack of coordination: Secondary | ICD-10-CM | POA: Diagnosis not present

## 2016-03-08 DIAGNOSIS — R2681 Unsteadiness on feet: Secondary | ICD-10-CM | POA: Diagnosis not present

## 2016-03-08 DIAGNOSIS — R262 Difficulty in walking, not elsewhere classified: Secondary | ICD-10-CM | POA: Diagnosis not present

## 2016-03-08 DIAGNOSIS — M6281 Muscle weakness (generalized): Secondary | ICD-10-CM | POA: Diagnosis not present

## 2016-03-08 NOTE — Telephone Encounter (Signed)
Notes Recorded by Deneise Lever, MD on 03/07/2016 at 1:44 PM EDT CXR- There has been definite progression of the fibrosis over the last year and a half. I can't say that Esbriet would be any more effective than Ofev, and it has it's own GI side effect issues. But if he wants to try it- ok to start Esbriet protocol. ----------------  Spoke with pt, aware of recs.  Pt would like to try Esbriet.  Forms left up front for pt to fill out, pt will come tomorrow to pick these forms up.    Will forward message to KW and CY to make aware, and to follow up on esbriet forms.

## 2016-03-08 NOTE — Telephone Encounter (Signed)
Noted  

## 2016-03-09 ENCOUNTER — Telehealth: Payer: Self-pay | Admitting: Internal Medicine

## 2016-03-09 NOTE — Telephone Encounter (Signed)
Pt decided he did not want to start Esbriet at this time.

## 2016-03-09 NOTE — Telephone Encounter (Signed)
Called spoke with pt. He reports he has decided against starting the esbriet. Will forward to Dr. Annamaria Boots as an Juluis Rainier

## 2016-03-09 NOTE — Telephone Encounter (Signed)
Noted  

## 2016-03-10 DIAGNOSIS — M6281 Muscle weakness (generalized): Secondary | ICD-10-CM | POA: Diagnosis not present

## 2016-03-10 DIAGNOSIS — R278 Other lack of coordination: Secondary | ICD-10-CM | POA: Diagnosis not present

## 2016-03-10 DIAGNOSIS — R262 Difficulty in walking, not elsewhere classified: Secondary | ICD-10-CM | POA: Diagnosis not present

## 2016-03-10 DIAGNOSIS — R2681 Unsteadiness on feet: Secondary | ICD-10-CM | POA: Diagnosis not present

## 2016-03-14 DIAGNOSIS — R262 Difficulty in walking, not elsewhere classified: Secondary | ICD-10-CM | POA: Diagnosis not present

## 2016-03-14 DIAGNOSIS — R2681 Unsteadiness on feet: Secondary | ICD-10-CM | POA: Diagnosis not present

## 2016-03-14 DIAGNOSIS — R278 Other lack of coordination: Secondary | ICD-10-CM | POA: Diagnosis not present

## 2016-03-14 DIAGNOSIS — M6281 Muscle weakness (generalized): Secondary | ICD-10-CM | POA: Diagnosis not present

## 2016-03-17 DIAGNOSIS — M6281 Muscle weakness (generalized): Secondary | ICD-10-CM | POA: Diagnosis not present

## 2016-03-17 DIAGNOSIS — R278 Other lack of coordination: Secondary | ICD-10-CM | POA: Diagnosis not present

## 2016-03-17 DIAGNOSIS — R2681 Unsteadiness on feet: Secondary | ICD-10-CM | POA: Diagnosis not present

## 2016-03-17 DIAGNOSIS — R262 Difficulty in walking, not elsewhere classified: Secondary | ICD-10-CM | POA: Diagnosis not present

## 2016-03-19 NOTE — Assessment & Plan Note (Signed)
He is needing to stay on oxygen more consistently now, as expected.

## 2016-03-19 NOTE — Assessment & Plan Note (Signed)
Clinically progressive UIP with increasing dyspnea and cough. He is now choosing to stay off therapy. Plan-chest x-ray

## 2016-03-22 DIAGNOSIS — R262 Difficulty in walking, not elsewhere classified: Secondary | ICD-10-CM | POA: Diagnosis not present

## 2016-03-22 DIAGNOSIS — R278 Other lack of coordination: Secondary | ICD-10-CM | POA: Diagnosis not present

## 2016-03-22 DIAGNOSIS — M6281 Muscle weakness (generalized): Secondary | ICD-10-CM | POA: Diagnosis not present

## 2016-03-22 DIAGNOSIS — R2681 Unsteadiness on feet: Secondary | ICD-10-CM | POA: Diagnosis not present

## 2016-03-24 DIAGNOSIS — R2681 Unsteadiness on feet: Secondary | ICD-10-CM | POA: Diagnosis not present

## 2016-03-24 DIAGNOSIS — R262 Difficulty in walking, not elsewhere classified: Secondary | ICD-10-CM | POA: Diagnosis not present

## 2016-03-24 DIAGNOSIS — R278 Other lack of coordination: Secondary | ICD-10-CM | POA: Diagnosis not present

## 2016-03-24 DIAGNOSIS — M6281 Muscle weakness (generalized): Secondary | ICD-10-CM | POA: Diagnosis not present

## 2016-03-30 DIAGNOSIS — M6281 Muscle weakness (generalized): Secondary | ICD-10-CM | POA: Diagnosis not present

## 2016-03-30 DIAGNOSIS — R2681 Unsteadiness on feet: Secondary | ICD-10-CM | POA: Diagnosis not present

## 2016-03-30 DIAGNOSIS — R278 Other lack of coordination: Secondary | ICD-10-CM | POA: Diagnosis not present

## 2016-03-30 DIAGNOSIS — R262 Difficulty in walking, not elsewhere classified: Secondary | ICD-10-CM | POA: Diagnosis not present

## 2016-04-01 DIAGNOSIS — R262 Difficulty in walking, not elsewhere classified: Secondary | ICD-10-CM | POA: Diagnosis not present

## 2016-04-01 DIAGNOSIS — R278 Other lack of coordination: Secondary | ICD-10-CM | POA: Diagnosis not present

## 2016-04-01 DIAGNOSIS — R2681 Unsteadiness on feet: Secondary | ICD-10-CM | POA: Diagnosis not present

## 2016-04-01 DIAGNOSIS — M6281 Muscle weakness (generalized): Secondary | ICD-10-CM | POA: Diagnosis not present

## 2016-04-06 DIAGNOSIS — R262 Difficulty in walking, not elsewhere classified: Secondary | ICD-10-CM | POA: Diagnosis not present

## 2016-04-06 DIAGNOSIS — R2681 Unsteadiness on feet: Secondary | ICD-10-CM | POA: Diagnosis not present

## 2016-04-06 DIAGNOSIS — R278 Other lack of coordination: Secondary | ICD-10-CM | POA: Diagnosis not present

## 2016-04-06 DIAGNOSIS — M6281 Muscle weakness (generalized): Secondary | ICD-10-CM | POA: Diagnosis not present

## 2016-04-07 ENCOUNTER — Other Ambulatory Visit: Payer: Self-pay | Admitting: Internal Medicine

## 2016-04-08 DIAGNOSIS — R262 Difficulty in walking, not elsewhere classified: Secondary | ICD-10-CM | POA: Diagnosis not present

## 2016-04-08 DIAGNOSIS — R278 Other lack of coordination: Secondary | ICD-10-CM | POA: Diagnosis not present

## 2016-04-08 DIAGNOSIS — M6281 Muscle weakness (generalized): Secondary | ICD-10-CM | POA: Diagnosis not present

## 2016-04-08 DIAGNOSIS — R2681 Unsteadiness on feet: Secondary | ICD-10-CM | POA: Diagnosis not present

## 2016-04-11 DIAGNOSIS — R2681 Unsteadiness on feet: Secondary | ICD-10-CM | POA: Diagnosis not present

## 2016-04-11 DIAGNOSIS — R262 Difficulty in walking, not elsewhere classified: Secondary | ICD-10-CM | POA: Diagnosis not present

## 2016-04-11 DIAGNOSIS — M6281 Muscle weakness (generalized): Secondary | ICD-10-CM | POA: Diagnosis not present

## 2016-04-11 DIAGNOSIS — R278 Other lack of coordination: Secondary | ICD-10-CM | POA: Diagnosis not present

## 2016-04-19 DIAGNOSIS — M6281 Muscle weakness (generalized): Secondary | ICD-10-CM | POA: Diagnosis not present

## 2016-04-19 DIAGNOSIS — R278 Other lack of coordination: Secondary | ICD-10-CM | POA: Diagnosis not present

## 2016-04-19 DIAGNOSIS — R262 Difficulty in walking, not elsewhere classified: Secondary | ICD-10-CM | POA: Diagnosis not present

## 2016-04-19 DIAGNOSIS — R2681 Unsteadiness on feet: Secondary | ICD-10-CM | POA: Diagnosis not present

## 2016-04-21 DIAGNOSIS — M6281 Muscle weakness (generalized): Secondary | ICD-10-CM | POA: Diagnosis not present

## 2016-04-21 DIAGNOSIS — R262 Difficulty in walking, not elsewhere classified: Secondary | ICD-10-CM | POA: Diagnosis not present

## 2016-04-21 DIAGNOSIS — R278 Other lack of coordination: Secondary | ICD-10-CM | POA: Diagnosis not present

## 2016-04-21 DIAGNOSIS — R2681 Unsteadiness on feet: Secondary | ICD-10-CM | POA: Diagnosis not present

## 2016-04-26 ENCOUNTER — Other Ambulatory Visit: Payer: Self-pay | Admitting: Internal Medicine

## 2016-05-03 DIAGNOSIS — Z23 Encounter for immunization: Secondary | ICD-10-CM | POA: Diagnosis not present

## 2016-05-24 DIAGNOSIS — J9611 Chronic respiratory failure with hypoxia: Secondary | ICD-10-CM | POA: Diagnosis not present

## 2016-05-24 DIAGNOSIS — Z7984 Long term (current) use of oral hypoglycemic drugs: Secondary | ICD-10-CM | POA: Diagnosis not present

## 2016-05-24 DIAGNOSIS — J8410 Pulmonary fibrosis, unspecified: Secondary | ICD-10-CM | POA: Diagnosis not present

## 2016-05-24 DIAGNOSIS — I10 Essential (primary) hypertension: Secondary | ICD-10-CM | POA: Diagnosis not present

## 2016-05-24 DIAGNOSIS — E1140 Type 2 diabetes mellitus with diabetic neuropathy, unspecified: Secondary | ICD-10-CM | POA: Diagnosis not present

## 2016-05-25 ENCOUNTER — Other Ambulatory Visit: Payer: Self-pay | Admitting: Internal Medicine

## 2016-06-17 ENCOUNTER — Other Ambulatory Visit: Payer: Self-pay | Admitting: Internal Medicine

## 2016-06-27 ENCOUNTER — Telehealth: Payer: Self-pay | Admitting: Internal Medicine

## 2016-06-27 ENCOUNTER — Ambulatory Visit (INDEPENDENT_AMBULATORY_CARE_PROVIDER_SITE_OTHER): Payer: Medicare Other | Admitting: Internal Medicine

## 2016-06-27 ENCOUNTER — Encounter: Payer: Self-pay | Admitting: Internal Medicine

## 2016-06-27 DIAGNOSIS — J9611 Chronic respiratory failure with hypoxia: Secondary | ICD-10-CM

## 2016-06-27 DIAGNOSIS — J8410 Pulmonary fibrosis, unspecified: Secondary | ICD-10-CM | POA: Diagnosis not present

## 2016-06-27 NOTE — Telephone Encounter (Signed)
Spoke with Samuel Paul. He wants an appointment to see CY some time this week. Reports increased DOE. Samuel Paul has been scheduled to see CY today at 3:30pm. Nothing further was needed.

## 2016-06-27 NOTE — Progress Notes (Signed)
08/28/14-   M former smoker referred courtesy of Dr Felipa Eth for interstitial lung disease .Marland Kitchen6 MWT-6 minute walk test 92%, 88%, 93%, 555 feet. Oxygen desaturation during this exercise with good recovery upon resting. PFT 09/22/14- severe restriction, TLC 37%, DLCO33% CT chest HiRes 09/05/14 IMPRESSION: 1. The appearance of the lungs is compatible with interstitial lung disease, and the pattern is considered diagnostic of usual interstitial pneumonia (UIP) from an imaging standpoint. 2. Atherosclerosis, including 3 vessel coronary artery disease. 3. Additional incidental findings, as above. Electronically Signed  By: Vinnie Langton M.D.  On: 09/05/2014  .  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 Continues to wear O2 24/7 through [employer] OFEV stopped 5/18 for malaise/ nausea requiring UC visit. He recognizes gradually worsening dyspnea with exertion and more consistent need for home oxygen now. More persistent dry cough. He is interested in considering Esbriet trial, having failed to tolerate Ofev. 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 Daughter states patient is more of a mouth breather when he can not breathe. He has tried to hold oxygen at 2 L even with portable and that is no longer enough. Saturation 87% here today on 2 L pulse. He does not feel he has an acute infection, denies chest pain and ankle edema. CXR 03/07/2016 IMPRESSION: Enlargement of cardiac silhouette with question pulmonary arterial hypertension. Severe pulmonary fibrosis/usual interstitial pneumonitis. No acute abnormalities  ROS-see  HPI Constitutional:   + weight loss, night sweats, fevers, chills, fatigue, lassitude. HEENT:   No-  headaches, +difficulty swallowing, tooth/dental problems, sore throat,       No-  sneezing, itching, ear ache, nasal congestion, +post nasal drip,  CV:  No-   chest pain, orthopnea, PND, swelling in lower extremities, anasarca,                                                             dizziness, palpitations Resp: + shortness of breath with exertion or at rest.              productive cough,   + non-productive cough,  No- coughing up of blood.              No-   change in color of mucus.  wheezing.   Skin: No-   rash or lesions. GI:  heartburn, indigestion, no-abdominal pain, nausea, vomiting,  GU: . MS:  No-   joint pain or swelling.  . Neuro-     nothing unusual Psych:  No- change in mood or affect. No depression or anxiety.  No memory loss.  OBJ- Physical Exam    Tall, slender, healthy appearing man   O2 2 L , wheelchair General- Alert, Oriented, Affect-appropriate, Distress- none acute Skin- rash-none, lesions- none, excoriation- none Lymphadenopathy- none Head- atraumatic            Eyes- Gross vision intact, PERRLA, conjunctivae and secretions clear            Ears- Hearing, canals-normal  Nose- Clear, no-Septal dev, mucus, polyps, erosion, perforation             Throat- Mallampati II , mucosa clear , drainage- none, tonsils- atrophic Neck- flexible , trachea midline, no stridor , thyroid nl, carotid no bruit Chest - symmetrical excursion , unlabored           Heart/CV- RRR , no murmur , no gallop  , no rub, nl s1 s2                           - JVD- none , edema- none, stasis changes- none, varices- none           Lung- +  crackles lower third, wheeze- none, cough+ dry , dullness-none, rub- none           Chest wall-  Abd-  Br/ Gen/ Rectal- Not done, not indicated Extrem- cyanosis- none, clubbing- none, atrophy- none, strength- nl Neuro- grossly intact to  observation

## 2016-06-27 NOTE — Patient Instructions (Addendum)
Ok to set your oxygen between 2 and 4L/min as needed to be more comfortable. We would like to get oxygen saturation between 90 and 94% as a rough guide.  Order- [facility] please add humidifier to home oxygen concentrator  Consider trying otc nasal saline gel as needed to help with dry stuffy nose.  Please call as needed

## 2016-06-28 NOTE — Assessment & Plan Note (Signed)
As discussed. He can turn oxygen up as needed to maintain saturation in comfort range 90-94 percent. He can especially turn oxygen up for exertion. He finds starting out in the morning the hardest.

## 2016-06-28 NOTE — Assessment & Plan Note (Signed)
End-stage hypoxic respiratory failure due to UIP. Took advantage of daughters presence to discuss end-of-life and Hospice. Formal DO NOT RESUSCITATE statement not made but resuscitation would be futile and intubation/ventilator support not helpful. Plan-increase oxygen to arrange 2-4 liters as needed

## 2016-07-08 ENCOUNTER — Ambulatory Visit: Payer: Medicare Other | Admitting: Internal Medicine

## 2016-07-18 DIAGNOSIS — K5901 Slow transit constipation: Secondary | ICD-10-CM | POA: Diagnosis not present

## 2016-08-03 DIAGNOSIS — M6281 Muscle weakness (generalized): Secondary | ICD-10-CM | POA: Diagnosis not present

## 2016-08-03 DIAGNOSIS — R2681 Unsteadiness on feet: Secondary | ICD-10-CM | POA: Diagnosis not present

## 2016-08-03 DIAGNOSIS — R278 Other lack of coordination: Secondary | ICD-10-CM | POA: Diagnosis not present

## 2016-08-03 DIAGNOSIS — R262 Difficulty in walking, not elsewhere classified: Secondary | ICD-10-CM | POA: Diagnosis not present

## 2016-08-04 DIAGNOSIS — R2681 Unsteadiness on feet: Secondary | ICD-10-CM | POA: Diagnosis not present

## 2016-08-04 DIAGNOSIS — R278 Other lack of coordination: Secondary | ICD-10-CM | POA: Diagnosis not present

## 2016-08-04 DIAGNOSIS — M6281 Muscle weakness (generalized): Secondary | ICD-10-CM | POA: Diagnosis not present

## 2016-08-04 DIAGNOSIS — R262 Difficulty in walking, not elsewhere classified: Secondary | ICD-10-CM | POA: Diagnosis not present

## 2016-08-11 DIAGNOSIS — R2681 Unsteadiness on feet: Secondary | ICD-10-CM | POA: Diagnosis not present

## 2016-08-11 DIAGNOSIS — R278 Other lack of coordination: Secondary | ICD-10-CM | POA: Diagnosis not present

## 2016-08-11 DIAGNOSIS — R262 Difficulty in walking, not elsewhere classified: Secondary | ICD-10-CM | POA: Diagnosis not present

## 2016-08-11 DIAGNOSIS — M6281 Muscle weakness (generalized): Secondary | ICD-10-CM | POA: Diagnosis not present

## 2016-08-16 DIAGNOSIS — R262 Difficulty in walking, not elsewhere classified: Secondary | ICD-10-CM | POA: Diagnosis not present

## 2016-08-16 DIAGNOSIS — R278 Other lack of coordination: Secondary | ICD-10-CM | POA: Diagnosis not present

## 2016-08-16 DIAGNOSIS — R2681 Unsteadiness on feet: Secondary | ICD-10-CM | POA: Diagnosis not present

## 2016-08-16 DIAGNOSIS — M6281 Muscle weakness (generalized): Secondary | ICD-10-CM | POA: Diagnosis not present

## 2016-08-24 ENCOUNTER — Other Ambulatory Visit: Payer: Self-pay | Admitting: Internal Medicine

## 2016-08-26 DIAGNOSIS — I10 Essential (primary) hypertension: Secondary | ICD-10-CM | POA: Diagnosis not present

## 2016-08-26 DIAGNOSIS — Z0000 Encounter for general adult medical examination without abnormal findings: Secondary | ICD-10-CM | POA: Diagnosis not present

## 2016-08-26 DIAGNOSIS — J9611 Chronic respiratory failure with hypoxia: Secondary | ICD-10-CM | POA: Diagnosis not present

## 2016-08-26 DIAGNOSIS — J8410 Pulmonary fibrosis, unspecified: Secondary | ICD-10-CM | POA: Diagnosis not present

## 2016-08-26 DIAGNOSIS — Z1389 Encounter for screening for other disorder: Secondary | ICD-10-CM | POA: Diagnosis not present

## 2016-08-26 DIAGNOSIS — K5901 Slow transit constipation: Secondary | ICD-10-CM | POA: Diagnosis not present

## 2016-08-26 DIAGNOSIS — E1140 Type 2 diabetes mellitus with diabetic neuropathy, unspecified: Secondary | ICD-10-CM | POA: Diagnosis not present

## 2016-09-27 ENCOUNTER — Telehealth: Payer: Self-pay | Admitting: Internal Medicine

## 2016-09-27 DIAGNOSIS — J9611 Chronic respiratory failure with hypoxia: Secondary | ICD-10-CM | POA: Diagnosis not present

## 2016-09-27 DIAGNOSIS — I10 Essential (primary) hypertension: Secondary | ICD-10-CM | POA: Diagnosis not present

## 2016-09-27 DIAGNOSIS — Z7984 Long term (current) use of oral hypoglycemic drugs: Secondary | ICD-10-CM | POA: Diagnosis not present

## 2016-09-27 DIAGNOSIS — J8410 Pulmonary fibrosis, unspecified: Secondary | ICD-10-CM | POA: Diagnosis not present

## 2016-09-27 DIAGNOSIS — E1140 Type 2 diabetes mellitus with diabetic neuropathy, unspecified: Secondary | ICD-10-CM | POA: Diagnosis not present

## 2016-09-27 NOTE — Telephone Encounter (Signed)
Yes, with stipulation that Hospice providers be the primary order writers including controlled drugs.

## 2016-09-27 NOTE — Telephone Encounter (Signed)
Samuel Paul with Hospice is calling. She is needing to know if CY is willing to be pt's attending MD for hospice care.  CY - please advise. Thanks.

## 2016-09-27 NOTE — Telephone Encounter (Signed)
Spoke with Amy at [facility]. She is aware of CY's response. Nothing further was needed.

## 2016-09-28 DIAGNOSIS — R0902 Hypoxemia: Secondary | ICD-10-CM | POA: Diagnosis not present

## 2016-09-28 DIAGNOSIS — R630 Anorexia: Secondary | ICD-10-CM | POA: Diagnosis not present

## 2016-09-28 DIAGNOSIS — E119 Type 2 diabetes mellitus without complications: Secondary | ICD-10-CM | POA: Diagnosis not present

## 2016-09-28 DIAGNOSIS — E46 Unspecified protein-calorie malnutrition: Secondary | ICD-10-CM | POA: Diagnosis not present

## 2016-09-28 DIAGNOSIS — I10 Essential (primary) hypertension: Secondary | ICD-10-CM | POA: Diagnosis not present

## 2016-09-28 DIAGNOSIS — J84112 Idiopathic pulmonary fibrosis: Secondary | ICD-10-CM | POA: Diagnosis not present

## 2016-09-28 DIAGNOSIS — K219 Gastro-esophageal reflux disease without esophagitis: Secondary | ICD-10-CM | POA: Diagnosis not present

## 2016-09-28 DIAGNOSIS — R634 Abnormal weight loss: Secondary | ICD-10-CM | POA: Diagnosis not present

## 2016-09-28 DIAGNOSIS — J309 Allergic rhinitis, unspecified: Secondary | ICD-10-CM | POA: Diagnosis not present

## 2016-09-29 DIAGNOSIS — R630 Anorexia: Secondary | ICD-10-CM | POA: Diagnosis not present

## 2016-09-29 DIAGNOSIS — K219 Gastro-esophageal reflux disease without esophagitis: Secondary | ICD-10-CM | POA: Diagnosis not present

## 2016-09-29 DIAGNOSIS — R634 Abnormal weight loss: Secondary | ICD-10-CM | POA: Diagnosis not present

## 2016-09-29 DIAGNOSIS — E119 Type 2 diabetes mellitus without complications: Secondary | ICD-10-CM | POA: Diagnosis not present

## 2016-09-29 DIAGNOSIS — R0902 Hypoxemia: Secondary | ICD-10-CM | POA: Diagnosis not present

## 2016-09-29 DIAGNOSIS — J309 Allergic rhinitis, unspecified: Secondary | ICD-10-CM | POA: Diagnosis not present

## 2016-09-29 DIAGNOSIS — J84112 Idiopathic pulmonary fibrosis: Secondary | ICD-10-CM | POA: Diagnosis not present

## 2016-09-29 DIAGNOSIS — E46 Unspecified protein-calorie malnutrition: Secondary | ICD-10-CM | POA: Diagnosis not present

## 2016-09-29 DIAGNOSIS — I10 Essential (primary) hypertension: Secondary | ICD-10-CM | POA: Diagnosis not present

## 2016-09-30 DIAGNOSIS — E119 Type 2 diabetes mellitus without complications: Secondary | ICD-10-CM | POA: Diagnosis not present

## 2016-09-30 DIAGNOSIS — J84112 Idiopathic pulmonary fibrosis: Secondary | ICD-10-CM | POA: Diagnosis not present

## 2016-09-30 DIAGNOSIS — R0902 Hypoxemia: Secondary | ICD-10-CM | POA: Diagnosis not present

## 2016-09-30 DIAGNOSIS — R630 Anorexia: Secondary | ICD-10-CM | POA: Diagnosis not present

## 2016-09-30 DIAGNOSIS — I10 Essential (primary) hypertension: Secondary | ICD-10-CM | POA: Diagnosis not present

## 2016-09-30 DIAGNOSIS — R634 Abnormal weight loss: Secondary | ICD-10-CM | POA: Diagnosis not present

## 2016-10-03 ENCOUNTER — Encounter: Payer: Self-pay | Admitting: Internal Medicine

## 2016-10-03 ENCOUNTER — Non-Acute Institutional Stay (SKILLED_NURSING_FACILITY): Payer: Medicare Other | Admitting: Internal Medicine

## 2016-10-03 DIAGNOSIS — R634 Abnormal weight loss: Secondary | ICD-10-CM | POA: Diagnosis not present

## 2016-10-03 DIAGNOSIS — J8410 Pulmonary fibrosis, unspecified: Secondary | ICD-10-CM

## 2016-10-03 DIAGNOSIS — K219 Gastro-esophageal reflux disease without esophagitis: Secondary | ICD-10-CM | POA: Insufficient documentation

## 2016-10-03 DIAGNOSIS — I10 Essential (primary) hypertension: Secondary | ICD-10-CM

## 2016-10-03 DIAGNOSIS — K9289 Other specified diseases of the digestive system: Secondary | ICD-10-CM | POA: Insufficient documentation

## 2016-10-03 DIAGNOSIS — E1142 Type 2 diabetes mellitus with diabetic polyneuropathy: Secondary | ICD-10-CM

## 2016-10-03 DIAGNOSIS — J9611 Chronic respiratory failure with hypoxia: Secondary | ICD-10-CM | POA: Diagnosis not present

## 2016-10-03 DIAGNOSIS — R2681 Unsteadiness on feet: Secondary | ICD-10-CM

## 2016-10-03 DIAGNOSIS — E7800 Pure hypercholesterolemia, unspecified: Secondary | ICD-10-CM | POA: Insufficient documentation

## 2016-10-03 DIAGNOSIS — M810 Age-related osteoporosis without current pathological fracture: Secondary | ICD-10-CM | POA: Insufficient documentation

## 2016-10-03 NOTE — Progress Notes (Signed)
History and Physical     Location:  [facility] Room Number: N13 Place of Service:  SNF (31)  PCP: Jeanmarie Hubert, MD Patient Care Team: Estill Dooms, MD as PCP - General (Internal Medicine)  Extended Emergency Contact Information Primary Emergency Contact: [place] Phone: [phone] Mobile Phone: [phone] Relation: Daughter  Code Status: Full code Goals of Care: Advanced Directive information Advanced Directives 10/03/2016  Does Patient Have a Medical Advance Directive? Yes  Type of Advance Directive [place] in Chart? Yes      Chief Complaint  Patient presents with  . New Admit To SNF    admit to SNF 09/29/15 from Independent living     HPI: Patient is a 83 y.o. male seen today for admission to [facility] SNF.on 09/28/16. He tells me he has lived at [facility] years . He is having some difficulty with recall today. He has been followed by Dr. Felipa Eth. He has pulmonary fibrosis and is becoming SOB with minimal exertion. His wife died a year ago. He is no longer able to safely live independently.  Mr. Baratz is on Hospice services. He anticipates he may die soon.  Other chronic issues include gait instability and fall risk, generalized weakness, weight loss of 50# in about a year. DM oin diet control, but previously treated with Metformin, and HTN.  Past Medical History:  Diagnosis Date  . Diverticulosis   . DM (diabetes mellitus) ([place])   . GERD (gastroesophageal reflux disease)   . Hypercholesterolemia   . Hypertension   . Peptic ulcer   . Peripheral neuropathy ([place])   . Shingles    History reviewed. No pertinent surgical history.  reports that he quit smoking about 33 years ago. His smoking use included Pipe. He has never used smokeless tobacco. He reports that he drinks alcohol. He reports that he does not use drugs. Social History   Social History    . Marital status: Married    Spouse name: N/A  . Number of children: N/A  . Years of education: N/A   Occupational History  . clergyman    Social History Main Topics  . Smoking status: Former Smoker    Types: Pipe    Quit date: 08/02/1983  . Smokeless tobacco: Never Used     Comment: smoked pipe for around 50 years  . Alcohol use 0.0 oz/week     Comment: occasionally  . Drug use: No  . Sexual activity: No   Other Topics Concern  . Not on file   Social History Narrative   Admitted to [facility] 09/28/16   Former smoker-stopped 1985   Alcohol occasionally    POA      Family History  Problem Relation Age of Onset  . Rheum arthritis Mother   . Rheum arthritis Father   . Heart disease Brother   . Heart disease Brother     Health Maintenance  Topic Date Due  . Samul Dada  02/08/1940  . INFLUENZA VACCINE  Completed  . PNA vac Low Risk Adult  Completed    Allergies  Allergen Reactions  . Actonel  [Risedronate Sodium]   . Fosamax  [Alendronate Sodium]   . Hctz  [Hydrochlorothiazide]   . Iodinated Diagnostic Agents     Other reaction(s): Other (See Comments) Weird feeling  . Other     Dye during Gallbladder procedure    (  BSP dye)  . Tetanus-Diphth-Acell Pertussis     Allergies as of 10/03/2016      Reactions   Actonel  [risedronate Sodium]    Fosamax  [alendronate Sodium]    Hctz  [hydrochlorothiazide]    Iodinated Diagnostic Agents    Other reaction(s): Other (See Comments) Weird feeling   Other    Dye during Gallbladder procedure    (BSP dye)   Tetanus-diphth-acell Pertussis       Medication List       Accurate as of 10/03/16  1:11 PM. Always use your most recent med list.          aspirin 81 MG chewable tablet Chew by mouth daily.   BOOST GLUCOSE CONTROL PO Take by mouth. drink twice a day between meals   glipiZIDE 5 MG tablet Commonly known as:  GLUCOTROL Take 5 mg by mouth. Take 2 tablets once a day   morphine CONCENTRATE  10 mg / 0.5 ml concentrated solution Take by mouth. Give 0.65ml every 2 hours as needed for dyspnea   omeprazole 20 MG capsule Commonly known as:  PRILOSEC Take 20 mg by mouth daily as needed (indigestion).   polyethylene glycol packet Commonly known as:  MIRALAX / GLYCOLAX Take 17 g by mouth. Give 17 grams in 8 oz water once a day   ZINC OXIDE (TOPICAL) 25 % Oint Apply topically. Apply to buttocks every shift as needed       Review of Systems  Constitutional: Positive for unexpected weight change (50 # since early 2017). Negative for activity change, appetite change, fatigue and fever.  HENT: Negative for congestion, ear pain, hearing loss, rhinorrhea, sore throat, tinnitus, trouble swallowing and voice change.   Eyes:       Corrective lenses  Respiratory: Positive for cough and shortness of breath. Negative for choking, chest tightness and wheezing.        O2 dependent. PIF.  Cardiovascular: Negative for chest pain, palpitations and leg swelling.  Gastrointestinal: Negative for abdominal distention, abdominal pain, constipation, diarrhea and nausea.  Endocrine: Negative for cold intolerance, heat intolerance, polydipsia, polyphagia and polyuria.  Genitourinary: Negative for dysuria, frequency, testicular pain and urgency.       Not incontinent  Musculoskeletal: Positive for gait problem. Negative for arthralgias, back pain, myalgias and neck pain.  Skin: Negative for color change, pallor and rash.  Allergic/Immunologic: Positive for environmental allergies.  Neurological: Positive for light-headedness and numbness (in feet). Negative for dizziness, tremors, syncope, speech difficulty, weakness and headaches.  Hematological: Negative for adenopathy. Does not bruise/bleed easily.  Psychiatric/Behavioral: Negative for behavioral problems, confusion, decreased concentration, hallucinations and sleep disturbance. The patient is not nervous/anxious.     Vitals:   10/03/16 1250  BP:  (!) 105/56  Pulse: 75  Resp: 14  Temp: (!) 96.8 F (36 C)  SpO2: 99%  Weight: 110 lb (49.9 kg)  Height: 5\' 11"  (1.803 m)   Body mass index is 15.34 kg/m. Physical Exam  Constitutional: No distress.  emaciated  HENT:  Right Ear: External ear normal.  Left Ear: External ear normal.  Nose: Nose normal.  Mouth/Throat: Oropharynx is clear and moist. No oropharyngeal exudate.  Eyes: Conjunctivae and EOM are normal. Pupils are equal, round, and reactive to light.  Neck: No JVD present. No tracheal deviation present. No thyromegaly present.  Cardiovascular: Normal rate, regular rhythm, normal heart sounds and intact distal pulses.  Exam reveals no gallop and no friction rub.   No murmur heard. Pulmonary/Chest: No  respiratory distress. He has no wheezes. He has rales (dry, bilateral). He exhibits no tenderness.  Abdominal: He exhibits no distension and no mass. There is no tenderness.  Musculoskeletal: Normal range of motion. He exhibits no edema or tenderness.  Lymphadenopathy:    He has no cervical adenopathy.  Neurological: He is alert. He has normal reflexes. No cranial nerve deficit. Coordination abnormal.  confused  Skin: No rash noted. No erythema. No pallor.  Psychiatric: He has a normal mood and affect. His behavior is normal.    Labs reviewed: Basic Metabolic Panel:  Recent Labs  12/17/15 1819  NA 140  K 4.7  CL 100*  CO2 33*  GLUCOSE 75  BUN 14  CREATININE 0.77  CALCIUM 9.7   Liver Function Tests: No results for input(s): AST, ALT, ALKPHOS, BILITOT, PROT, ALBUMIN in the last 8760 hours. No results for input(s): LIPASE, AMYLASE in the last 8760 hours. No results for input(s): AMMONIA in the last 8760 hours. CBC:  Recent Labs  12/17/15 1819  WBC 10.7*  HGB 14.4  HCT 43.0  MCV 96.8  PLT 254   Cardiac Enzymes: No results for input(s): CKTOTAL, CKMB, CKMBINDEX, TROPONINI in the last 8760 hours. BNP: Invalid input(s): POCBNP No results found for:  HGBA1C No results found for: TSH No results found for: VITAMINB12 No results found for: FOLATE No results found for: IRON, TIBC, FERRITIN  Imaging and Procedures obtained prior to SNF admission: Dg Chest 2 View  Result Date: 03/07/2016 CLINICAL DATA:  Usual interstitial pneumonitis, interstitial lung disease, diabetes mellitus, hypertension, former smoker EXAM: CHEST  2 VIEW COMPARISON:  08/18/2015 FINDINGS: Enlargement of cardiac silhouette. Atherosclerotic calcification aorta. Mediastinal contours normal. Slight prominent central pulmonary arteries. Chronic interstitial infiltrates throughout both lungs consistent with pulmonary fibrosis and usual interstitial pneumonitis. No superimposed acute infiltrate, pleural effusion or pneumothorax. No discrete pulmonary mass/nodule identified. Bones demineralized. IMPRESSION: Enlargement of cardiac silhouette with question pulmonary arterial hypertension. Severe pulmonary fibrosis/usual interstitial pneumonitis. No acute abnormalities. Electronically Signed   By: Lavonia Dana M.D.   On: 03/07/2016 10:29    Assessment/Plan 1. Pulmonary interstitial fibrosis UIP Continue current meds  2. Chronic respiratory failure with hypoxia (HCC) Continue on Hospice.  3. General unsteadiness on examination Needs assistance with activity  4. Loss of weight Anorectic.  5. Type 2 diabetes mellitus with peripheral neuropathy (HCC) Controlled off metformin. Continues on glipizide.  6. Benign essential HTN Controlled off medication

## 2016-10-04 DIAGNOSIS — R634 Abnormal weight loss: Secondary | ICD-10-CM | POA: Diagnosis not present

## 2016-10-04 DIAGNOSIS — E119 Type 2 diabetes mellitus without complications: Secondary | ICD-10-CM | POA: Diagnosis not present

## 2016-10-04 DIAGNOSIS — R0902 Hypoxemia: Secondary | ICD-10-CM | POA: Diagnosis not present

## 2016-10-04 DIAGNOSIS — J84112 Idiopathic pulmonary fibrosis: Secondary | ICD-10-CM | POA: Diagnosis not present

## 2016-10-04 DIAGNOSIS — R630 Anorexia: Secondary | ICD-10-CM | POA: Diagnosis not present

## 2016-10-04 DIAGNOSIS — I10 Essential (primary) hypertension: Secondary | ICD-10-CM | POA: Diagnosis not present

## 2016-10-07 DIAGNOSIS — L602 Onychogryphosis: Secondary | ICD-10-CM | POA: Diagnosis not present

## 2016-10-07 DIAGNOSIS — M2042 Other hammer toe(s) (acquired), left foot: Secondary | ICD-10-CM | POA: Diagnosis not present

## 2016-10-07 DIAGNOSIS — M2041 Other hammer toe(s) (acquired), right foot: Secondary | ICD-10-CM | POA: Diagnosis not present

## 2016-10-14 DIAGNOSIS — I10 Essential (primary) hypertension: Secondary | ICD-10-CM | POA: Diagnosis not present

## 2016-10-14 DIAGNOSIS — E119 Type 2 diabetes mellitus without complications: Secondary | ICD-10-CM | POA: Diagnosis not present

## 2016-10-14 DIAGNOSIS — R0902 Hypoxemia: Secondary | ICD-10-CM | POA: Diagnosis not present

## 2016-10-14 DIAGNOSIS — R634 Abnormal weight loss: Secondary | ICD-10-CM | POA: Diagnosis not present

## 2016-10-14 DIAGNOSIS — R630 Anorexia: Secondary | ICD-10-CM | POA: Diagnosis not present

## 2016-10-14 DIAGNOSIS — J84112 Idiopathic pulmonary fibrosis: Secondary | ICD-10-CM | POA: Diagnosis not present

## 2016-10-15 DIAGNOSIS — R634 Abnormal weight loss: Secondary | ICD-10-CM | POA: Diagnosis not present

## 2016-10-15 DIAGNOSIS — R0902 Hypoxemia: Secondary | ICD-10-CM | POA: Diagnosis not present

## 2016-10-15 DIAGNOSIS — I10 Essential (primary) hypertension: Secondary | ICD-10-CM | POA: Diagnosis not present

## 2016-10-15 DIAGNOSIS — E119 Type 2 diabetes mellitus without complications: Secondary | ICD-10-CM | POA: Diagnosis not present

## 2016-10-15 DIAGNOSIS — R630 Anorexia: Secondary | ICD-10-CM | POA: Diagnosis not present

## 2016-10-15 DIAGNOSIS — J84112 Idiopathic pulmonary fibrosis: Secondary | ICD-10-CM | POA: Diagnosis not present

## 2016-10-18 DIAGNOSIS — R634 Abnormal weight loss: Secondary | ICD-10-CM | POA: Diagnosis not present

## 2016-10-18 DIAGNOSIS — R630 Anorexia: Secondary | ICD-10-CM | POA: Diagnosis not present

## 2016-10-18 DIAGNOSIS — J84112 Idiopathic pulmonary fibrosis: Secondary | ICD-10-CM | POA: Diagnosis not present

## 2016-10-18 DIAGNOSIS — E119 Type 2 diabetes mellitus without complications: Secondary | ICD-10-CM | POA: Diagnosis not present

## 2016-10-18 DIAGNOSIS — I10 Essential (primary) hypertension: Secondary | ICD-10-CM | POA: Diagnosis not present

## 2016-10-18 DIAGNOSIS — R0902 Hypoxemia: Secondary | ICD-10-CM | POA: Diagnosis not present

## 2016-10-19 IMAGING — CT CT CHEST HIGH RESOLUTION W/O CM
2 of 6 series · 8 of 36 positions shown, 10 images · non-contrast
Comparison: Chest x-ray 07/11/2014.

CLINICAL DATA: [AGE] male with 1-2 month history of dry
cough. Shortness of breath with exertion. Possible interstitial lung
disease noted on recent chest x-ray.

EXAM:
CT CHEST WITHOUT CONTRAST
TECHNIQUE: Multidetector CT imaging of the chest was performed following the
standard protocol without intravenous contrast. High resolution
imaging of the lungs, as well as inspiratory and expiratory imaging,
was performed.

[Series 8: hires retro entire lungs · axial · 0.64mm/px · z∈[-260,-80]mm · 5 of 28 slices shown, 7 images]
[im 5/28  mediastinal]
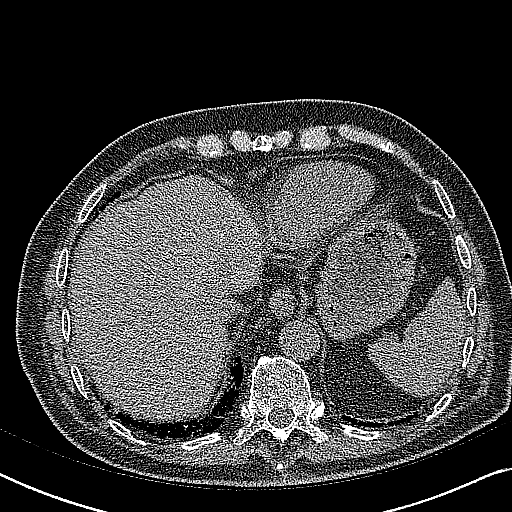
[im 5/28  lung]
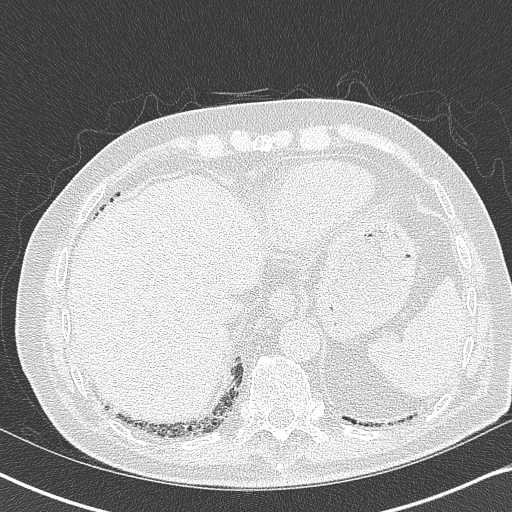
[im 10/28  lung]
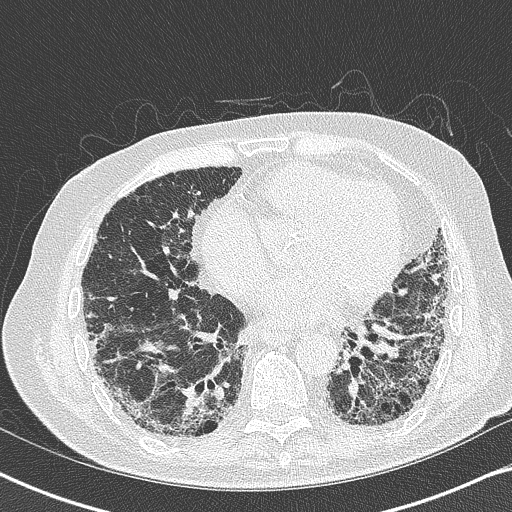
[im 14/28  lung]
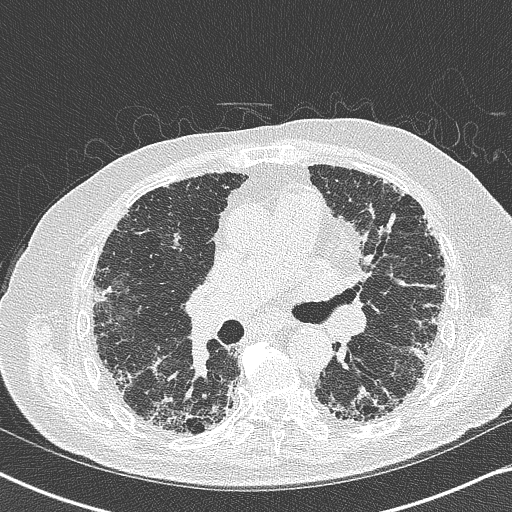
[im 19/28  lung]
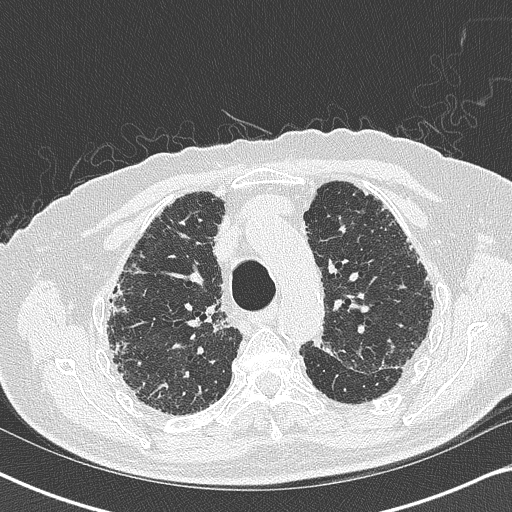
[im 23/28  mediastinal]
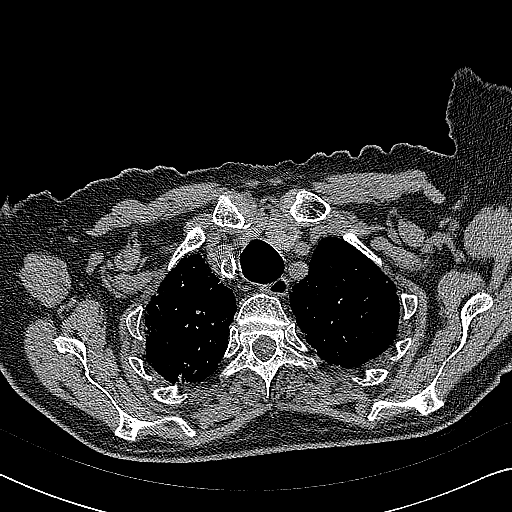
[im 23/28  lung]
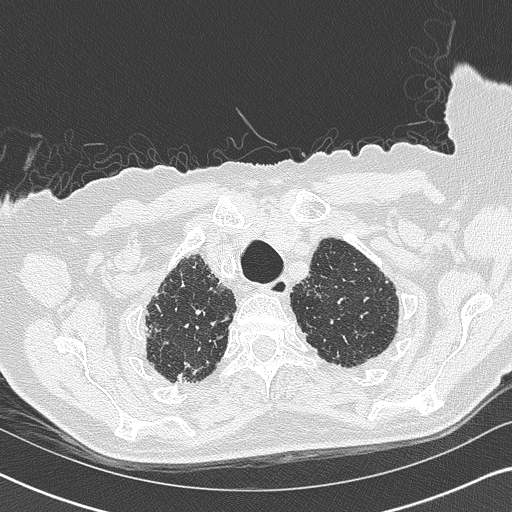

[Series 602: cor · coronal · 0.64mm/px · 3 of 104 slices shown]
[im 21/104  lung]
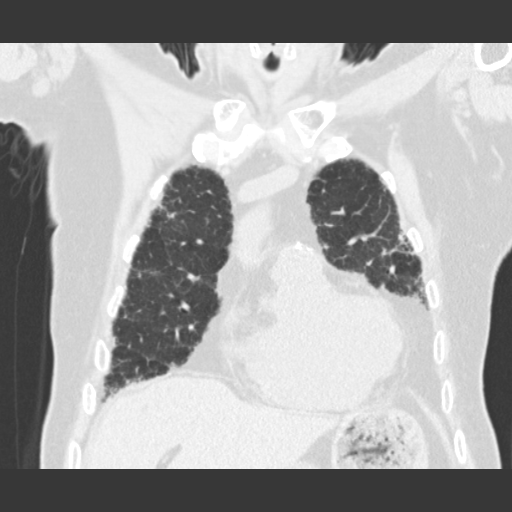
[im 42/104  lung]
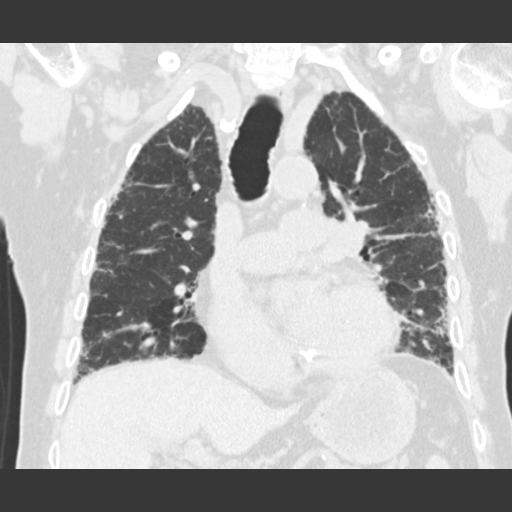
[im 62/104  lung]
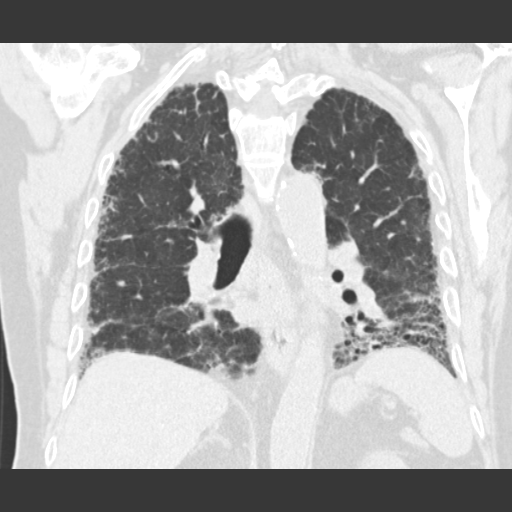

[8 of 36 positions shown; findings below may reference images not displayed]

FINDINGS: Mediastinum/Lymph Nodes: Heart size is normal. There is no
significant pericardial fluid, thickening or pericardial
calcification. There is atherosclerosis of the thoracic aorta, the
great vessels of the mediastinum and the coronary arteries,
including calcified atherosclerotic plaque in the left anterior
descending, left circumflex and right coronary arteries.
Calcifications of the aortic valve and mitral annulus. No
pathologically enlarged mediastinal or hilar lymph nodes. Please
note that accurate exclusion of hilar adenopathy is limited on
noncontrast CT scans. Esophagus is unremarkable in appearance. No
axillary lymphadenopathy.

Lungs/Pleura: High-resolution images demonstrate extensive
subpleural and peribronchovascular reticulation, multifocal
parenchymal banding, cylindrical and varicose traction
bronchiectasis and extensive regions of frank honeycombing. These
findings have a definitive craniocaudal gradient. Inspiratory and
expiratory imaging demonstrates only minimal air trapping. No
confluent consolidative airspace disease. No pleural effusions.

Upper Abdomen: Atherosclerosis.  Otherwise, unremarkable.

Musculoskeletal/Soft Tissues: There are no aggressive appearing
lytic or blastic lesions noted in the visualized portions of the
skeleton.
IMPRESSION: 1. The appearance of the lungs is compatible with interstitial lung
disease, and the pattern is considered diagnostic of usual
interstitial pneumonia (UIP) from an imaging standpoint.
2.  Atherosclerosis, including 3 vessel coronary artery disease.
3. Additional incidental findings, as above.

## 2016-10-20 DIAGNOSIS — J84112 Idiopathic pulmonary fibrosis: Secondary | ICD-10-CM | POA: Diagnosis not present

## 2016-10-20 DIAGNOSIS — R0902 Hypoxemia: Secondary | ICD-10-CM | POA: Diagnosis not present

## 2016-10-20 DIAGNOSIS — R630 Anorexia: Secondary | ICD-10-CM | POA: Diagnosis not present

## 2016-10-20 DIAGNOSIS — R634 Abnormal weight loss: Secondary | ICD-10-CM | POA: Diagnosis not present

## 2016-10-20 DIAGNOSIS — I10 Essential (primary) hypertension: Secondary | ICD-10-CM | POA: Diagnosis not present

## 2016-10-20 DIAGNOSIS — E119 Type 2 diabetes mellitus without complications: Secondary | ICD-10-CM | POA: Diagnosis not present

## 2016-10-21 DIAGNOSIS — J84112 Idiopathic pulmonary fibrosis: Secondary | ICD-10-CM | POA: Diagnosis not present

## 2016-10-21 DIAGNOSIS — E119 Type 2 diabetes mellitus without complications: Secondary | ICD-10-CM | POA: Diagnosis not present

## 2016-10-21 DIAGNOSIS — R634 Abnormal weight loss: Secondary | ICD-10-CM | POA: Diagnosis not present

## 2016-10-21 DIAGNOSIS — R0902 Hypoxemia: Secondary | ICD-10-CM | POA: Diagnosis not present

## 2016-10-21 DIAGNOSIS — I10 Essential (primary) hypertension: Secondary | ICD-10-CM | POA: Diagnosis not present

## 2016-10-21 DIAGNOSIS — R630 Anorexia: Secondary | ICD-10-CM | POA: Diagnosis not present

## 2016-10-25 DIAGNOSIS — R630 Anorexia: Secondary | ICD-10-CM | POA: Diagnosis not present

## 2016-10-25 DIAGNOSIS — R0902 Hypoxemia: Secondary | ICD-10-CM | POA: Diagnosis not present

## 2016-10-25 DIAGNOSIS — I10 Essential (primary) hypertension: Secondary | ICD-10-CM | POA: Diagnosis not present

## 2016-10-25 DIAGNOSIS — J84112 Idiopathic pulmonary fibrosis: Secondary | ICD-10-CM | POA: Diagnosis not present

## 2016-10-25 DIAGNOSIS — R634 Abnormal weight loss: Secondary | ICD-10-CM | POA: Diagnosis not present

## 2016-10-25 DIAGNOSIS — E119 Type 2 diabetes mellitus without complications: Secondary | ICD-10-CM | POA: Diagnosis not present

## 2016-10-26 DIAGNOSIS — R634 Abnormal weight loss: Secondary | ICD-10-CM | POA: Diagnosis not present

## 2016-10-26 DIAGNOSIS — R0902 Hypoxemia: Secondary | ICD-10-CM | POA: Diagnosis not present

## 2016-10-26 DIAGNOSIS — I10 Essential (primary) hypertension: Secondary | ICD-10-CM | POA: Diagnosis not present

## 2016-10-26 DIAGNOSIS — R630 Anorexia: Secondary | ICD-10-CM | POA: Diagnosis not present

## 2016-10-26 DIAGNOSIS — J84112 Idiopathic pulmonary fibrosis: Secondary | ICD-10-CM | POA: Diagnosis not present

## 2016-10-26 DIAGNOSIS — E119 Type 2 diabetes mellitus without complications: Secondary | ICD-10-CM | POA: Diagnosis not present

## 2016-10-27 ENCOUNTER — Ambulatory Visit: Payer: Medicare Other | Admitting: Internal Medicine

## 2016-10-30 DIAGNOSIS — J309 Allergic rhinitis, unspecified: Secondary | ICD-10-CM | POA: Diagnosis not present

## 2016-10-30 DIAGNOSIS — L8990 Pressure ulcer of unspecified site, unspecified stage: Secondary | ICD-10-CM | POA: Diagnosis not present

## 2016-10-30 DIAGNOSIS — R630 Anorexia: Secondary | ICD-10-CM | POA: Diagnosis not present

## 2016-10-30 DIAGNOSIS — I10 Essential (primary) hypertension: Secondary | ICD-10-CM | POA: Diagnosis not present

## 2016-10-30 DIAGNOSIS — R634 Abnormal weight loss: Secondary | ICD-10-CM | POA: Diagnosis not present

## 2016-10-30 DIAGNOSIS — J84112 Idiopathic pulmonary fibrosis: Secondary | ICD-10-CM | POA: Diagnosis not present

## 2016-10-30 DIAGNOSIS — E119 Type 2 diabetes mellitus without complications: Secondary | ICD-10-CM | POA: Diagnosis not present

## 2016-10-30 DIAGNOSIS — K219 Gastro-esophageal reflux disease without esophagitis: Secondary | ICD-10-CM | POA: Diagnosis not present

## 2016-10-30 DIAGNOSIS — R0902 Hypoxemia: Secondary | ICD-10-CM | POA: Diagnosis not present

## 2016-10-30 DIAGNOSIS — E46 Unspecified protein-calorie malnutrition: Secondary | ICD-10-CM | POA: Diagnosis not present

## 2016-11-01 ENCOUNTER — Non-Acute Institutional Stay (SKILLED_NURSING_FACILITY): Payer: Medicare Other | Admitting: Nurse Practitioner

## 2016-11-01 ENCOUNTER — Encounter: Payer: Self-pay | Admitting: Nurse Practitioner

## 2016-11-01 DIAGNOSIS — K5900 Constipation, unspecified: Secondary | ICD-10-CM

## 2016-11-01 DIAGNOSIS — E1142 Type 2 diabetes mellitus with diabetic polyneuropathy: Secondary | ICD-10-CM | POA: Diagnosis not present

## 2016-11-01 DIAGNOSIS — I10 Essential (primary) hypertension: Secondary | ICD-10-CM | POA: Diagnosis not present

## 2016-11-01 DIAGNOSIS — J8410 Pulmonary fibrosis, unspecified: Secondary | ICD-10-CM

## 2016-11-01 DIAGNOSIS — R634 Abnormal weight loss: Secondary | ICD-10-CM | POA: Diagnosis not present

## 2016-11-01 DIAGNOSIS — R630 Anorexia: Secondary | ICD-10-CM | POA: Diagnosis not present

## 2016-11-01 DIAGNOSIS — E119 Type 2 diabetes mellitus without complications: Secondary | ICD-10-CM | POA: Diagnosis not present

## 2016-11-01 DIAGNOSIS — K9289 Other specified diseases of the digestive system: Secondary | ICD-10-CM

## 2016-11-01 DIAGNOSIS — J84112 Idiopathic pulmonary fibrosis: Secondary | ICD-10-CM | POA: Diagnosis not present

## 2016-11-01 DIAGNOSIS — R0902 Hypoxemia: Secondary | ICD-10-CM | POA: Diagnosis not present

## 2016-11-01 DIAGNOSIS — K219 Gastro-esophageal reflux disease without esophagitis: Secondary | ICD-10-CM

## 2016-11-01 NOTE — Progress Notes (Signed)
Location:  [place] Room Number: 31 Place of Service:  SNF (31) Provider:  Iliana Hutt, Manxie  NP  Jeanmarie Hubert, MD  Patient Care Team: Estill Dooms, MD as PCP - General (Internal Medicine)  Extended Emergency Contact Information Primary Emergency Contact: [place] Phone: [phone] Mobile Phone: [phone] Relation: Daughter  Code Status:  DNR Goals of care: Advanced Directive information Advanced Directives 11/01/2016  Does Patient Have a Medical Advance Directive? Yes  Type of Advance Directive [place]  Does patient want to make changes to medical advance directive? No - Patient declined  Copy of [place] in Chart? Yes     Chief Complaint  Patient presents with  . Medical Management of Chronic Issues    HPI:  Pt is a 83 y.o. male seen today for medical management of chronic diseases.    c/o hard stools, taking MiraLax prn almost daily, baseline frequency of BM 2-3 days  Hx of T2DM, taking Glipizide 10mg , Pulmonary fibrosis, Hospice service, O2, prn Morphine 5mg  q2h, prn MiraLax, Omeprazole 20mg  Past Medical History:  Diagnosis Date  . Diverticulosis   . DM (diabetes mellitus) ([place])   . GERD (gastroesophageal reflux disease)   . Hypercholesterolemia   . Hypertension   . Peptic ulcer   . Peripheral neuropathy ([place])   . Shingles    History reviewed. No pertinent surgical history.  Allergies  Allergen Reactions  . Actonel  [Risedronate Sodium]   . Fosamax  [Alendronate Sodium]   . Hctz  [Hydrochlorothiazide]   . Iodinated Diagnostic Agents     Other reaction(s): Other (See Comments) Weird feeling  . Other     Dye during Gallbladder procedure    (BSP dye)  . Tetanus-Diphth-Acell Pertussis     Allergies as of 11/01/2016      Reactions   Actonel  [risedronate Sodium]    Fosamax  [alendronate Sodium]    Hctz  [hydrochlorothiazide]    Iodinated Diagnostic  Agents    Other reaction(s): Other (See Comments) Weird feeling   Other    Dye during Gallbladder procedure    (BSP dye)   Tetanus-diphth-acell Pertussis       Medication List       Accurate as of 11/01/16  1:33 PM. Always use your most recent med list.          aspirin 81 MG chewable tablet Chew by mouth daily.   BOOST GLUCOSE CONTROL PO Take by mouth. drink twice a day between meals   glipiZIDE 5 MG tablet Commonly known as:  GLUCOTROL Take 5 mg by mouth. Take 2 tablets once a day   morphine CONCENTRATE 10 mg / 0.5 ml concentrated solution Take by mouth. Give 0.30ml every 2 hours as needed for dyspnea   omeprazole 20 MG capsule Commonly known as:  PRILOSEC Take 20 mg by mouth daily as needed (indigestion).   polyethylene glycol packet Commonly known as:  MIRALAX / GLYCOLAX Take 17 g by mouth. Give 17 grams in 8 oz water once a day   ZINC OXIDE (TOPICAL) 25 % Oint Apply topically. Apply to buttocks every shift as needed       Review of Systems  Constitutional: Positive for unexpected weight change (50 # since early 2017). Negative for activity change, appetite change, fatigue and fever.  HENT: Negative for congestion, ear pain, hearing loss, rhinorrhea, sore throat, tinnitus, trouble swallowing and voice change.   Eyes:  Corrective lenses  Respiratory: Positive for cough and shortness of breath. Negative for choking, chest tightness and wheezing.        O2 dependent. PIF.  Cardiovascular: Negative for chest pain, palpitations and leg swelling.  Gastrointestinal: Positive for constipation. Negative for abdominal distention, abdominal pain, diarrhea and nausea.  Endocrine: Negative for cold intolerance, heat intolerance, polydipsia, polyphagia and polyuria.  Genitourinary: Negative for dysuria, frequency, testicular pain and urgency.       Not incontinent  Musculoskeletal: Positive for gait problem. Negative for arthralgias, back pain, myalgias and neck pain.   Skin: Negative for color change, pallor and rash.  Allergic/Immunologic: Positive for environmental allergies.  Neurological: Positive for light-headedness and numbness (in feet). Negative for dizziness, tremors, syncope, speech difficulty, weakness and headaches.  Hematological: Negative for adenopathy. Does not bruise/bleed easily.  Psychiatric/Behavioral: Negative for behavioral problems, confusion, decreased concentration, hallucinations and sleep disturbance. The patient is not nervous/anxious.     Immunization History  Administered Date(s) Administered  . Influenza Split 06/04/2014, 05/05/2015, 05/03/2016  . Pneumococcal Conjugate-13 11/20/2013  . Pneumococcal Polysaccharide-23 04/12/2011   Pertinent  Health Maintenance Due  Topic Date Due  . FOOT EXAM  02/08/1931  . OPHTHALMOLOGY EXAM  02/08/1931  . URINE MICROALBUMIN  02/08/1931  . HEMOGLOBIN A1C  01/27/2016  . INFLUENZA VACCINE  03/01/2017  . PNA vac Low Risk Adult  Completed   No flowsheet data found. Functional Status Survey:    Vitals:   11/01/16 1139  BP: 120/74  Pulse: 88  Resp: 20  Temp: 98 F (36.7 C)  SpO2: 97%  Weight: 109 lb (49.4 kg)  Height: 5\' 11"  (1.803 m)   Body mass index is 15.2 kg/m. Physical Exam  Constitutional: No distress.  emaciated  HENT:  Right Ear: External ear normal.  Left Ear: External ear normal.  Nose: Nose normal.  Mouth/Throat: Oropharynx is clear and moist. No oropharyngeal exudate.  Eyes: Conjunctivae and EOM are normal. Pupils are equal, round, and reactive to light.  Neck: No JVD present. No tracheal deviation present. No thyromegaly present.  Cardiovascular: Normal rate, regular rhythm, normal heart sounds and intact distal pulses.  Exam reveals no gallop and no friction rub.   No murmur heard. Pulmonary/Chest: No respiratory distress. He has no wheezes. He has rales (dry, bilateral). He exhibits no tenderness.  Abdominal: He exhibits no distension and no mass. There  is no tenderness.  Musculoskeletal: Normal range of motion. He exhibits no edema or tenderness.  Lymphadenopathy:    He has no cervical adenopathy.  Neurological: He is alert. He has normal reflexes. No cranial nerve deficit. Coordination abnormal.  confused  Skin: No rash noted. No erythema. No pallor.  Psychiatric: He has a normal mood and affect. His behavior is normal.    Labs reviewed:  Recent Labs  12/17/15 1819  NA 140  K 4.7  CL 100*  CO2 33*  GLUCOSE 75  BUN 14  CREATININE 0.77  CALCIUM 9.7   No results for input(s): AST, ALT, ALKPHOS, BILITOT, PROT, ALBUMIN in the last 8760 hours.  Recent Labs  12/17/15 1819  WBC 10.7*  HGB 14.4  HCT 43.0  MCV 96.8  PLT 254   No results found for: TSH No results found for: HGBA1C No results found for: CHOL, HDL, LDLCALC, LDLDIRECT, TRIG, CHOLHDL  Significant Diagnostic Results in last 30 days:  No results found.  Assessment/Plan Type 2 diabetes mellitus with peripheral neuropathy (HCC) Blood sugar is controlled, taking Glipizide 10mg  daily  Pulmonary  interstitial fibrosis UIP Hospice Service, O2, prn Morphine  Duodenogastric reflux Stable, taking Omeprazole 20mg  daily.   Benign essential HTN Controlled, no meds.   Constipation Taking MiraLax daily prn, BM 2-3 days is his baseline, c/o hard stools, will try Colace 100mg  bid. Observe the patient.     Family/ staff Communication: SNF Hospice Service.   Labs/tests ordered:  none

## 2016-11-01 NOTE — Assessment & Plan Note (Signed)
Taking MiraLax daily prn, BM 2-3 days is his baseline, c/o hard stools, will try Colace 100mg  bid. Observe the patient.

## 2016-11-01 NOTE — Assessment & Plan Note (Signed)
Blood sugar is controlled, taking Glipizide 10mg  daily

## 2016-11-01 NOTE — Assessment & Plan Note (Signed)
Controlled, no meds.

## 2016-11-01 NOTE — Assessment & Plan Note (Signed)
Hospice Service, O2, prn Morphine

## 2016-11-01 NOTE — Assessment & Plan Note (Signed)
Stable, taking Omeprazole 20mg  daily.

## 2016-11-03 DIAGNOSIS — R634 Abnormal weight loss: Secondary | ICD-10-CM | POA: Diagnosis not present

## 2016-11-03 DIAGNOSIS — E119 Type 2 diabetes mellitus without complications: Secondary | ICD-10-CM | POA: Diagnosis not present

## 2016-11-03 DIAGNOSIS — R630 Anorexia: Secondary | ICD-10-CM | POA: Diagnosis not present

## 2016-11-03 DIAGNOSIS — I10 Essential (primary) hypertension: Secondary | ICD-10-CM | POA: Diagnosis not present

## 2016-11-03 DIAGNOSIS — R0902 Hypoxemia: Secondary | ICD-10-CM | POA: Diagnosis not present

## 2016-11-03 DIAGNOSIS — J84112 Idiopathic pulmonary fibrosis: Secondary | ICD-10-CM | POA: Diagnosis not present

## 2016-11-07 DIAGNOSIS — R0902 Hypoxemia: Secondary | ICD-10-CM | POA: Diagnosis not present

## 2016-11-07 DIAGNOSIS — R634 Abnormal weight loss: Secondary | ICD-10-CM | POA: Diagnosis not present

## 2016-11-07 DIAGNOSIS — R630 Anorexia: Secondary | ICD-10-CM | POA: Diagnosis not present

## 2016-11-07 DIAGNOSIS — I10 Essential (primary) hypertension: Secondary | ICD-10-CM | POA: Diagnosis not present

## 2016-11-07 DIAGNOSIS — E119 Type 2 diabetes mellitus without complications: Secondary | ICD-10-CM | POA: Diagnosis not present

## 2016-11-07 DIAGNOSIS — J84112 Idiopathic pulmonary fibrosis: Secondary | ICD-10-CM | POA: Diagnosis not present

## 2016-11-09 DIAGNOSIS — I10 Essential (primary) hypertension: Secondary | ICD-10-CM | POA: Diagnosis not present

## 2016-11-09 DIAGNOSIS — R634 Abnormal weight loss: Secondary | ICD-10-CM | POA: Diagnosis not present

## 2016-11-09 DIAGNOSIS — R0902 Hypoxemia: Secondary | ICD-10-CM | POA: Diagnosis not present

## 2016-11-09 DIAGNOSIS — R630 Anorexia: Secondary | ICD-10-CM | POA: Diagnosis not present

## 2016-11-09 DIAGNOSIS — E119 Type 2 diabetes mellitus without complications: Secondary | ICD-10-CM | POA: Diagnosis not present

## 2016-11-09 DIAGNOSIS — J84112 Idiopathic pulmonary fibrosis: Secondary | ICD-10-CM | POA: Diagnosis not present

## 2016-11-10 DIAGNOSIS — J8410 Pulmonary fibrosis, unspecified: Secondary | ICD-10-CM | POA: Diagnosis not present

## 2016-11-10 LAB — HEMOGLOBIN A1C: Hemoglobin A1C: 5.4

## 2016-11-11 ENCOUNTER — Encounter: Payer: Self-pay | Admitting: Nurse Practitioner

## 2016-11-11 ENCOUNTER — Non-Acute Institutional Stay (SKILLED_NURSING_FACILITY): Payer: Medicare Other | Admitting: Nurse Practitioner

## 2016-11-11 DIAGNOSIS — R627 Adult failure to thrive: Secondary | ICD-10-CM | POA: Diagnosis not present

## 2016-11-11 DIAGNOSIS — K219 Gastro-esophageal reflux disease without esophagitis: Secondary | ICD-10-CM

## 2016-11-11 DIAGNOSIS — E1142 Type 2 diabetes mellitus with diabetic polyneuropathy: Secondary | ICD-10-CM | POA: Diagnosis not present

## 2016-11-11 DIAGNOSIS — I10 Essential (primary) hypertension: Secondary | ICD-10-CM | POA: Diagnosis not present

## 2016-11-11 DIAGNOSIS — J8410 Pulmonary fibrosis, unspecified: Secondary | ICD-10-CM | POA: Diagnosis not present

## 2016-11-11 DIAGNOSIS — K9289 Other specified diseases of the digestive system: Secondary | ICD-10-CM

## 2016-11-11 DIAGNOSIS — K5900 Constipation, unspecified: Secondary | ICD-10-CM

## 2016-11-11 NOTE — Assessment & Plan Note (Signed)
Admits not much appetite and has lost 6 pounds from 163 pounds to 157 pounds since January. This began before OFEV weight loss, 5% in 30 days, will update CBC CMP TSH Hgb a1c, adding Mirtazapine 7.5mg  qhs if patient and POA are in agreement. The patient is under Hospice Service presently for comfort measures.

## 2016-11-11 NOTE — Progress Notes (Signed)
Location:  [place] Room Number: 31 Place of Service:  SNF (31) Provider:  Mast, Manxie  NP  Jeanmarie Hubert, MD  Patient Care Team: Estill Dooms, MD as PCP - General (Internal Medicine)  Extended Emergency Contact Information Primary Emergency Contact: [place] Phone: [phone] Mobile Phone: [phone] Relation: Daughter  Code Status:  DNR Goals of care: Advanced Directive information Advanced Directives 11/11/2016  Does Patient Have a Medical Advance Directive? Yes  Type of Advance Directive [place]  Does patient want to make changes to medical advance directive? No - Patient declined  Copy of [place] in Chart? Yes     Chief Complaint  Patient presents with  . Acute Visit    Weight loss from110lbs--->104lbs.    HPI:  Pt is a 83 y.o. male seen today for an acute visit for weight loss, 5% in 30 days.    Hx of T2DM, taking Glipizide 10mg , Pulmonary fibrosis, Hospice service, O2, prn Morphine 5mg  q2h, prn MiraLax, Omeprazole 20mg  Past Medical History:  Diagnosis Date  . Diverticulosis   . DM (diabetes mellitus) ([place])   . GERD (gastroesophageal reflux disease)   . Hypercholesterolemia   . Hypertension   . Peptic ulcer   . Peripheral neuropathy ([place])   . Shingles    History reviewed. No pertinent surgical history.  Allergies  Allergen Reactions  . Actonel  [Risedronate Sodium]   . Fosamax  [Alendronate Sodium]   . Hctz  [Hydrochlorothiazide]   . Iodinated Diagnostic Agents     Other reaction(s): Other (See Comments) Weird feeling  . Other     Dye during Gallbladder procedure    (BSP dye)  . Tetanus-Diphth-Acell Pertussis     Outpatient Encounter Prescriptions as of 11/11/2016  Medication Sig  . aspirin 81 MG chewable tablet Chew by mouth daily.  Marland Kitchen glipiZIDE (GLUCOTROL) 5 MG tablet Take 5 mg by mouth. Take 2 tablets once a day  . Morphine Sulfate  (MORPHINE CONCENTRATE) 10 mg / 0.5 ml concentrated solution Take by mouth. Give 0.76ml every 2 hours as needed for dyspnea  . Nutritional Supplements (BOOST GLUCOSE CONTROL PO) Take by mouth. drink twice a day between meals  . omeprazole (PRILOSEC) 20 MG capsule Take 20 mg by mouth daily as needed (indigestion).   . polyethylene glycol (MIRALAX / GLYCOLAX) packet Take 17 g by mouth. Give 17 grams in 8 oz water once a day  . ZINC OXIDE, TOPICAL, 25 % OINT Apply topically. Apply to buttocks every shift as needed   No facility-administered encounter medications on file as of 11/11/2016.     Review of Systems  Constitutional: Positive for unexpected weight change (50 # since early 2017). Negative for activity change, appetite change, fatigue and fever.  HENT: Negative for congestion, ear pain, hearing loss, rhinorrhea, sore throat, tinnitus, trouble swallowing and voice change.   Eyes:       Corrective lenses  Respiratory: Positive for cough and shortness of breath. Negative for choking, chest tightness and wheezing.        O2 dependent. PIF.  Cardiovascular: Negative for chest pain, palpitations and leg swelling.  Gastrointestinal: Positive for constipation. Negative for abdominal distention, abdominal pain, diarrhea and nausea.  Endocrine: Negative for cold intolerance, heat intolerance, polydipsia, polyphagia and polyuria.  Genitourinary: Negative for dysuria, frequency, testicular pain and urgency.       Not incontinent  Musculoskeletal: Positive for gait problem. Negative for arthralgias,  back pain, myalgias and neck pain.  Skin: Negative for color change, pallor and rash.  Allergic/Immunologic: Positive for environmental allergies.  Neurological: Positive for light-headedness and numbness (in feet). Negative for dizziness, tremors, syncope, speech difficulty, weakness and headaches.  Hematological: Negative for adenopathy. Does not bruise/bleed easily.  Psychiatric/Behavioral: Negative for  behavioral problems, confusion, decreased concentration, hallucinations and sleep disturbance. The patient is not nervous/anxious.     Immunization History  Administered Date(s) Administered  . Influenza Split 06/04/2014, 05/05/2015, 05/03/2016  . Pneumococcal Conjugate-13 11/20/2013  . Pneumococcal Polysaccharide-23 04/12/2011   Pertinent  Health Maintenance Due  Topic Date Due  . FOOT EXAM  02/08/1931  . OPHTHALMOLOGY EXAM  02/08/1931  . URINE MICROALBUMIN  02/08/1931  . HEMOGLOBIN A1C  01/27/2016  . INFLUENZA VACCINE  03/01/2017  . PNA vac Low Risk Adult  Completed   No flowsheet data found. Functional Status Survey:    Vitals:   11/11/16 1204  BP: 112/66  Pulse: 70  Resp: 20  Temp: 98 F (36.7 C)  SpO2: 97%  Weight: 104 lb (47.2 kg)   Body mass index is 14.51 kg/m. Physical Exam  Constitutional: No distress.  emaciated  HENT:  Right Ear: External ear normal.  Left Ear: External ear normal.  Nose: Nose normal.  Mouth/Throat: Oropharynx is clear and moist. No oropharyngeal exudate.  Eyes: Conjunctivae and EOM are normal. Pupils are equal, round, and reactive to light.  Neck: No JVD present. No tracheal deviation present. No thyromegaly present.  Cardiovascular: Normal rate, regular rhythm, normal heart sounds and intact distal pulses.  Exam reveals no gallop and no friction rub.   No murmur heard. Pulmonary/Chest: No respiratory distress. He has no wheezes. He has rales (dry, bilateral). He exhibits no tenderness.  Abdominal: He exhibits no distension and no mass. There is no tenderness.  Musculoskeletal: Normal range of motion. He exhibits no edema or tenderness.  Lymphadenopathy:    He has no cervical adenopathy.  Neurological: He is alert. He has normal reflexes. No cranial nerve deficit. Coordination abnormal.  confused  Skin: No rash noted. No erythema. No pallor.  Psychiatric: He has a normal mood and affect. His behavior is normal.    Labs  reviewed:  Recent Labs  12/17/15 1819  NA 140  K 4.7  CL 100*  CO2 33*  GLUCOSE 75  BUN 14  CREATININE 0.77  CALCIUM 9.7   No results for input(s): AST, ALT, ALKPHOS, BILITOT, PROT, ALBUMIN in the last 8760 hours.  Recent Labs  12/17/15 1819  WBC 10.7*  HGB 14.4  HCT 43.0  MCV 96.8  PLT 254   No results found for: TSH Lab Results  Component Value Date   HGBA1C 5.4 11/10/2016   No results found for: CHOL, HDL, LDLCALC, LDLDIRECT, TRIG, CHOLHDL  Significant Diagnostic Results in last 30 days:  No results found.  Assessment/Plan Adult failure to thrive  Admits not much appetite and has lost 6 pounds from 163 pounds to 157 pounds since January. This began before OFEV weight loss, 5% in 30 days, will update CBC CMP TSH Hgb a1c, adding Mirtazapine 7.5mg  qhs if patient and POA are in agreement. The patient is under Hospice Service presently for comfort measures.   Benign essential HTN Controlled, no meds.   Pulmonary interstitial fibrosis UIP Hospice Service, O2, prn Morphine  Constipation Stable. Taking MiraLax daily prn, Colace 100mg  bid. Observe the patient.   Type 2 diabetes mellitus with peripheral neuropathy (HCC) Blood sugar is controlled, taking  Glipizide 10mg  daily, update Hgb a1c  Duodenogastric reflux Stable, taking Omeprazole 20mg  daily.      Family/ staff Communication: SNF Hospice Service  Labs/tests ordered:  CBC CMP TSH Hgb a1c

## 2016-11-11 NOTE — Assessment & Plan Note (Signed)
Hospice Service, O2, prn Morphine

## 2016-11-11 NOTE — Assessment & Plan Note (Signed)
Controlled, no meds.

## 2016-11-11 NOTE — Assessment & Plan Note (Signed)
Blood sugar is controlled, taking Glipizide 10mg  daily, update Hgb a1c

## 2016-11-11 NOTE — Assessment & Plan Note (Signed)
Stable. Taking MiraLax daily prn, Colace 100mg  bid. Observe the patient.

## 2016-11-11 NOTE — Assessment & Plan Note (Signed)
Stable, taking Omeprazole 20mg  daily.

## 2016-11-14 DIAGNOSIS — M6281 Muscle weakness (generalized): Secondary | ICD-10-CM | POA: Diagnosis not present

## 2016-11-14 DIAGNOSIS — R278 Other lack of coordination: Secondary | ICD-10-CM | POA: Diagnosis not present

## 2016-11-14 DIAGNOSIS — R601 Generalized edema: Secondary | ICD-10-CM | POA: Diagnosis not present

## 2016-11-14 DIAGNOSIS — J8410 Pulmonary fibrosis, unspecified: Secondary | ICD-10-CM | POA: Diagnosis not present

## 2016-11-14 DIAGNOSIS — R5381 Other malaise: Secondary | ICD-10-CM | POA: Diagnosis not present

## 2016-11-14 DIAGNOSIS — I509 Heart failure, unspecified: Secondary | ICD-10-CM | POA: Diagnosis not present

## 2016-11-14 LAB — CBC AND DIFFERENTIAL
HCT: 36 % — AB (ref 41–53)
Hemoglobin: 11.7 g/dL — AB (ref 13.5–17.5)
PLATELETS: 208 10*3/uL (ref 150–399)
WBC: 8.2 10^3/mL

## 2016-11-14 LAB — BASIC METABOLIC PANEL
BUN: 19 mg/dL (ref 4–21)
CREATININE: 0.7 mg/dL (ref <=1.3)
Glucose: 116 mg/dL
POTASSIUM: 4.8 mmol/L (ref 3.4–5.3)
SODIUM: 144 mmol/L (ref 137–147)

## 2016-11-14 LAB — HEPATIC FUNCTION PANEL
ALK PHOS: 61 U/L (ref 25–125)
ALT: 11 U/L (ref 10–40)
AST: 14 U/L (ref 14–40)
Bilirubin, Total: 0.2 mg/dL

## 2016-11-14 LAB — TSH: TSH: 1.01 u[IU]/mL (ref <=5.90)

## 2016-11-14 LAB — HEMOGLOBIN A1C: Hemoglobin A1C: 5.6

## 2016-11-15 ENCOUNTER — Other Ambulatory Visit: Payer: Self-pay | Admitting: *Deleted

## 2016-11-17 DIAGNOSIS — R630 Anorexia: Secondary | ICD-10-CM | POA: Diagnosis not present

## 2016-11-17 DIAGNOSIS — J84112 Idiopathic pulmonary fibrosis: Secondary | ICD-10-CM | POA: Diagnosis not present

## 2016-11-17 DIAGNOSIS — R634 Abnormal weight loss: Secondary | ICD-10-CM | POA: Diagnosis not present

## 2016-11-17 DIAGNOSIS — E119 Type 2 diabetes mellitus without complications: Secondary | ICD-10-CM | POA: Diagnosis not present

## 2016-11-17 DIAGNOSIS — R0902 Hypoxemia: Secondary | ICD-10-CM | POA: Diagnosis not present

## 2016-11-17 DIAGNOSIS — I10 Essential (primary) hypertension: Secondary | ICD-10-CM | POA: Diagnosis not present

## 2016-11-18 DIAGNOSIS — I10 Essential (primary) hypertension: Secondary | ICD-10-CM | POA: Diagnosis not present

## 2016-11-18 DIAGNOSIS — J84112 Idiopathic pulmonary fibrosis: Secondary | ICD-10-CM | POA: Diagnosis not present

## 2016-11-18 DIAGNOSIS — R630 Anorexia: Secondary | ICD-10-CM | POA: Diagnosis not present

## 2016-11-18 DIAGNOSIS — E119 Type 2 diabetes mellitus without complications: Secondary | ICD-10-CM | POA: Diagnosis not present

## 2016-11-18 DIAGNOSIS — R634 Abnormal weight loss: Secondary | ICD-10-CM | POA: Diagnosis not present

## 2016-11-18 DIAGNOSIS — R0902 Hypoxemia: Secondary | ICD-10-CM | POA: Diagnosis not present

## 2016-11-22 DIAGNOSIS — R634 Abnormal weight loss: Secondary | ICD-10-CM | POA: Diagnosis not present

## 2016-11-22 DIAGNOSIS — E119 Type 2 diabetes mellitus without complications: Secondary | ICD-10-CM | POA: Diagnosis not present

## 2016-11-22 DIAGNOSIS — J84112 Idiopathic pulmonary fibrosis: Secondary | ICD-10-CM | POA: Diagnosis not present

## 2016-11-22 DIAGNOSIS — I10 Essential (primary) hypertension: Secondary | ICD-10-CM | POA: Diagnosis not present

## 2016-11-22 DIAGNOSIS — R630 Anorexia: Secondary | ICD-10-CM | POA: Diagnosis not present

## 2016-11-22 DIAGNOSIS — R0902 Hypoxemia: Secondary | ICD-10-CM | POA: Diagnosis not present

## 2016-11-23 ENCOUNTER — Other Ambulatory Visit: Payer: Self-pay | Admitting: *Deleted

## 2016-11-23 DIAGNOSIS — R634 Abnormal weight loss: Secondary | ICD-10-CM | POA: Diagnosis not present

## 2016-11-23 DIAGNOSIS — R0902 Hypoxemia: Secondary | ICD-10-CM | POA: Diagnosis not present

## 2016-11-23 DIAGNOSIS — J84112 Idiopathic pulmonary fibrosis: Secondary | ICD-10-CM | POA: Diagnosis not present

## 2016-11-23 DIAGNOSIS — I10 Essential (primary) hypertension: Secondary | ICD-10-CM | POA: Diagnosis not present

## 2016-11-23 DIAGNOSIS — E119 Type 2 diabetes mellitus without complications: Secondary | ICD-10-CM | POA: Diagnosis not present

## 2016-11-23 DIAGNOSIS — R630 Anorexia: Secondary | ICD-10-CM | POA: Diagnosis not present

## 2016-11-29 DEATH — deceased

## 2017-10-01 IMAGING — DX DG CHEST 2V
2 series · 2 of 2 positions shown · non-contrast
Comparison: Chest radiograph 03/03/2015.

CLINICAL DATA: Patient with history of pulmonary fibrosis.

EXAM:
CHEST  2 VIEW

[chest pa]
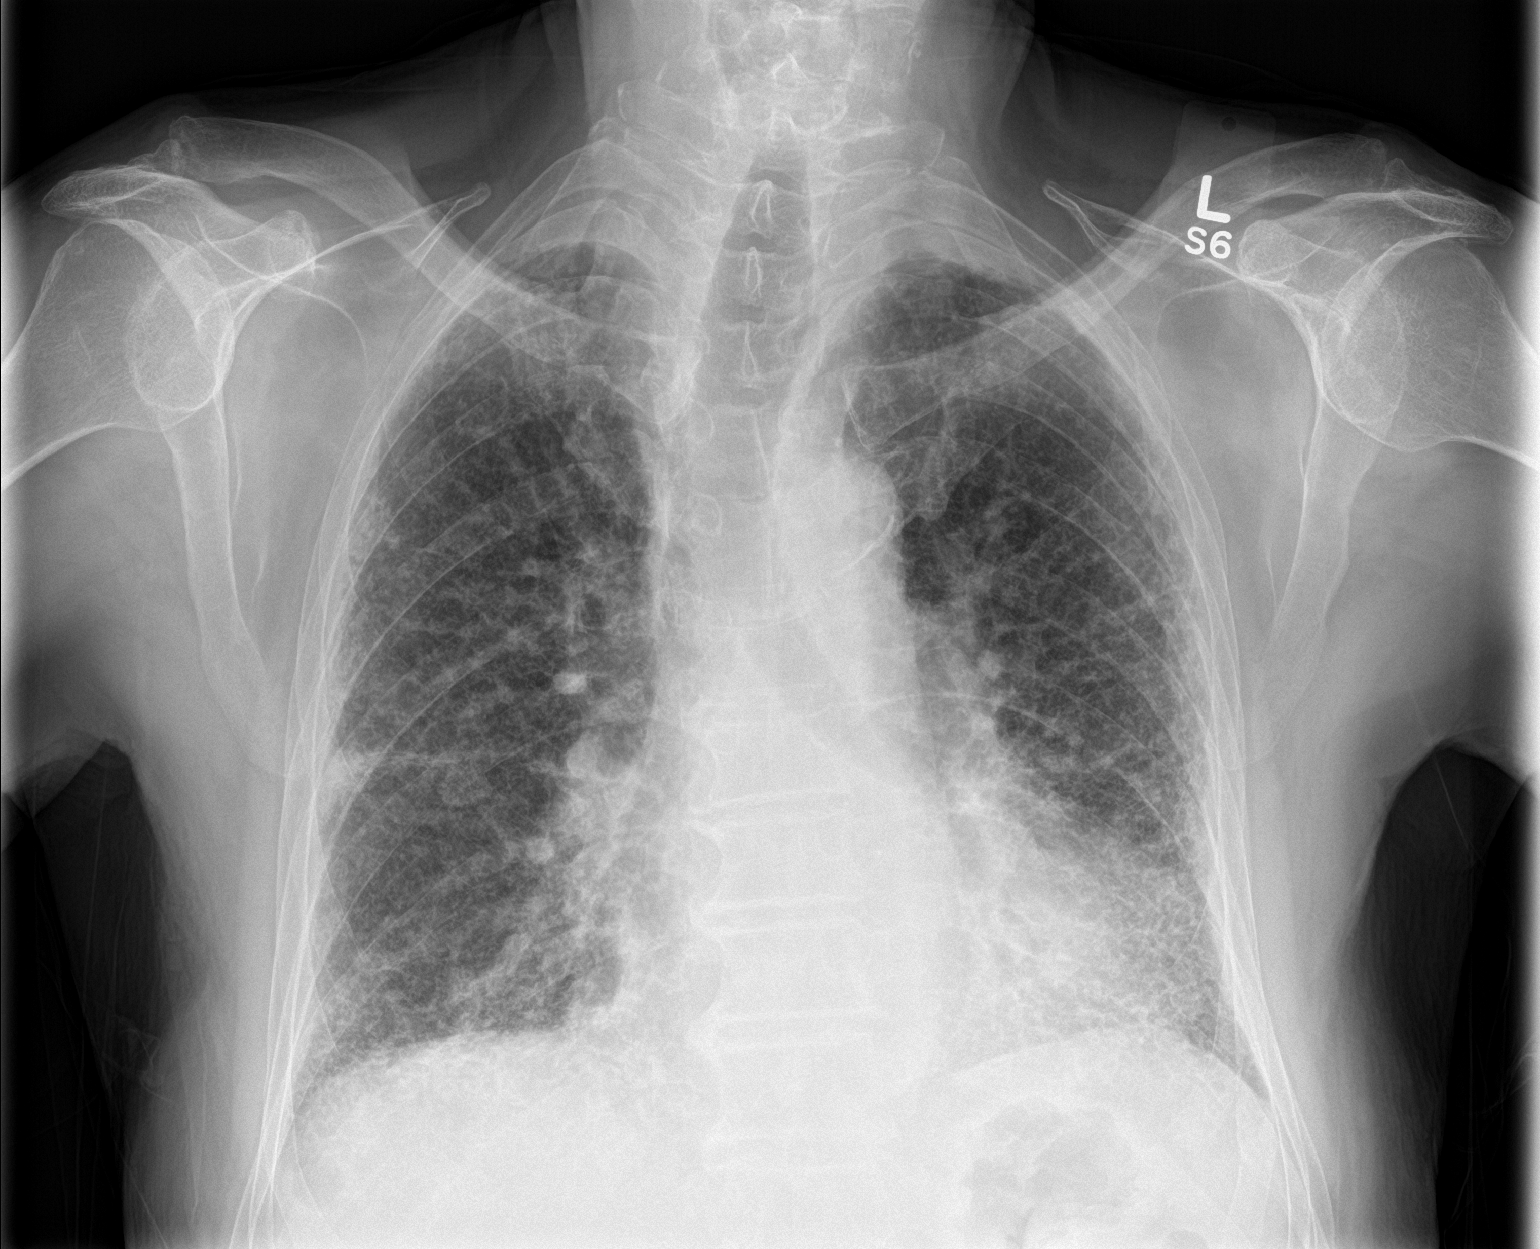

[chest lat]
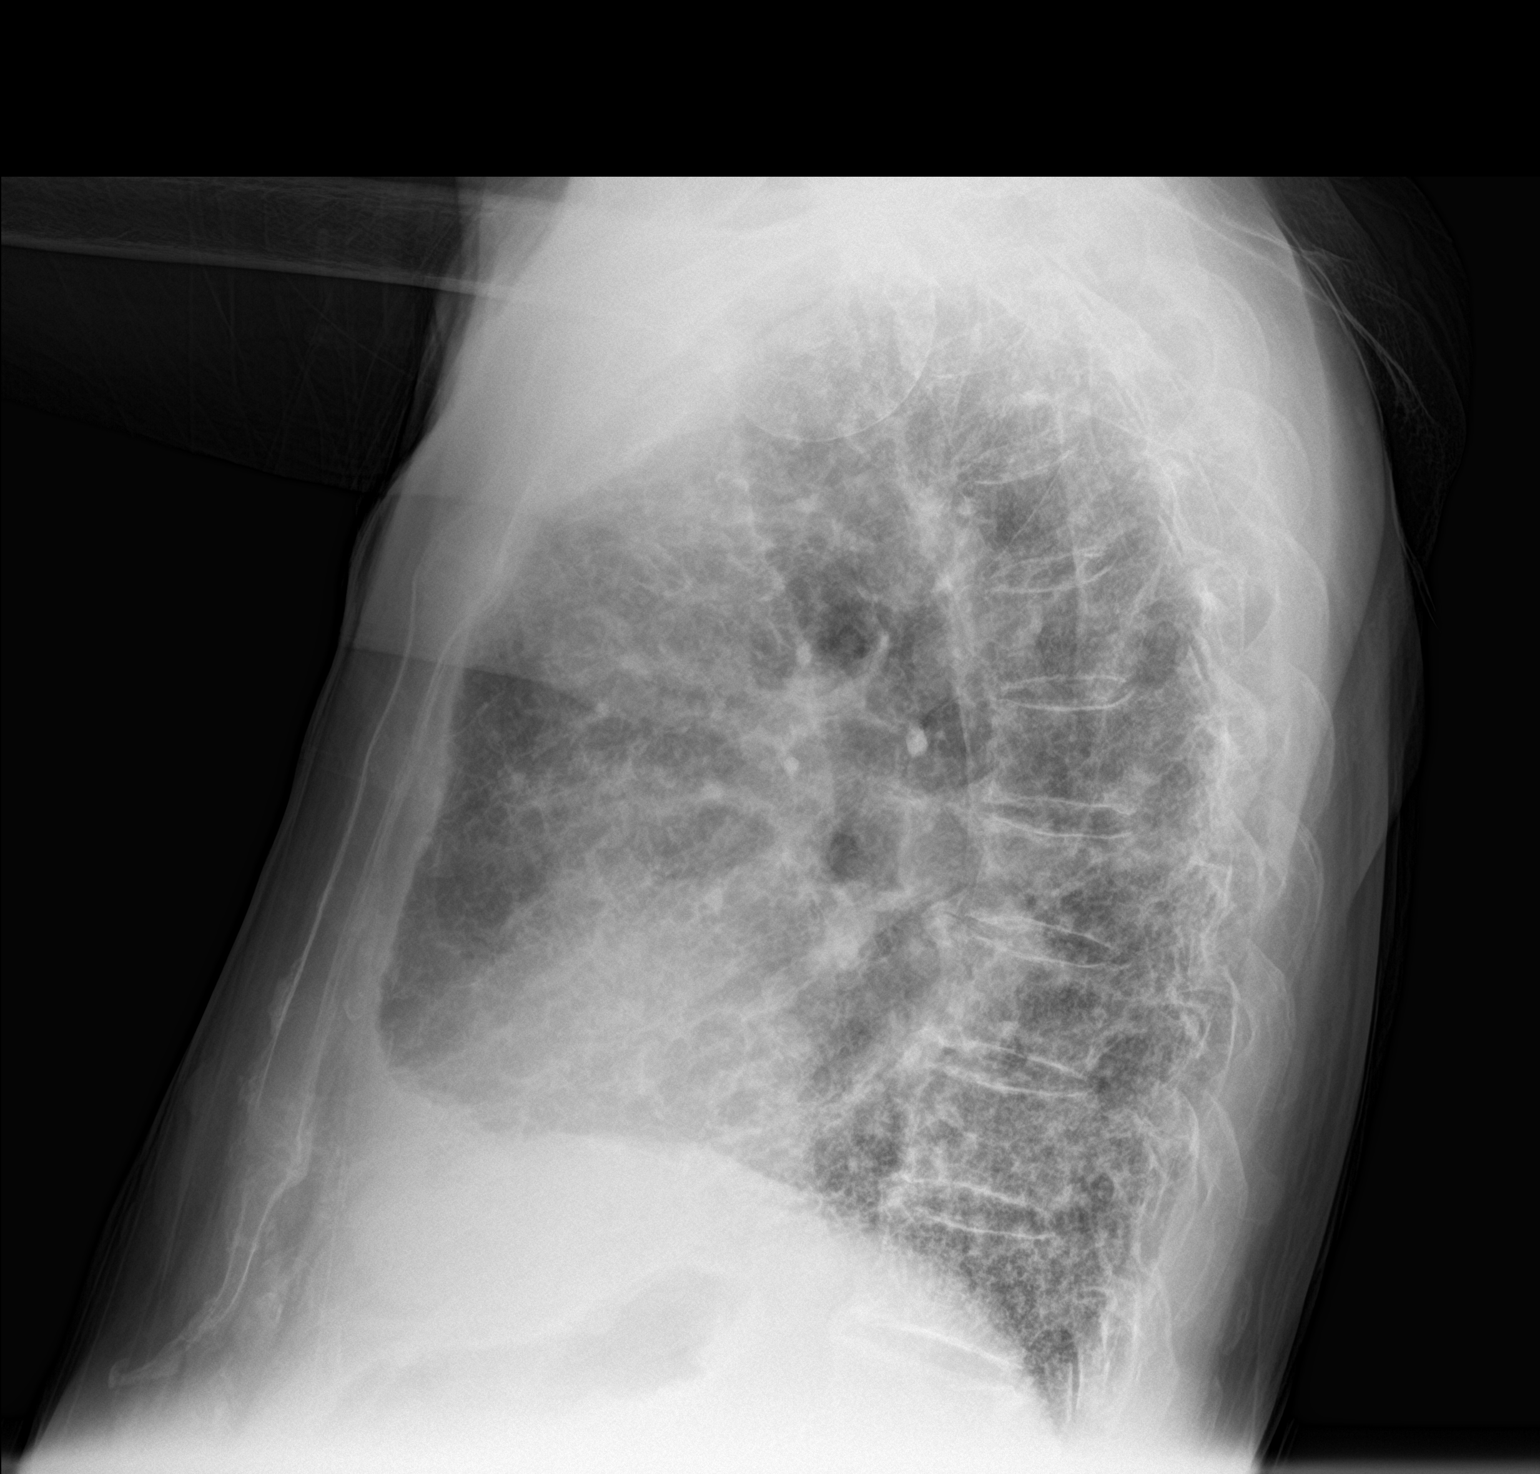

[2 of 2 positions shown; findings below may reference images not displayed]

FINDINGS: Stable cardiac and mediastinal contours. No significant interval
change diffuse bilateral coarse interstitial pulmonary opacities,
worse in the lung bases. No pleural effusion or pneumothorax. No
superimposed pulmonary consolidation. Regional skeleton is
unremarkable.
IMPRESSION: Grossly unchanged pulmonary fibrosis without evidence for
superimposed pulmonary consolidation.

## 2018-04-21 IMAGING — DX DG CHEST 2V
2 series · 2 of 2 positions shown · non-contrast
Comparison: 08/18/2015

CLINICAL DATA: Usual interstitial pneumonitis, interstitial lung
disease, diabetes mellitus, hypertension, former smoker

EXAM:
CHEST  2 VIEW

[chest pa]
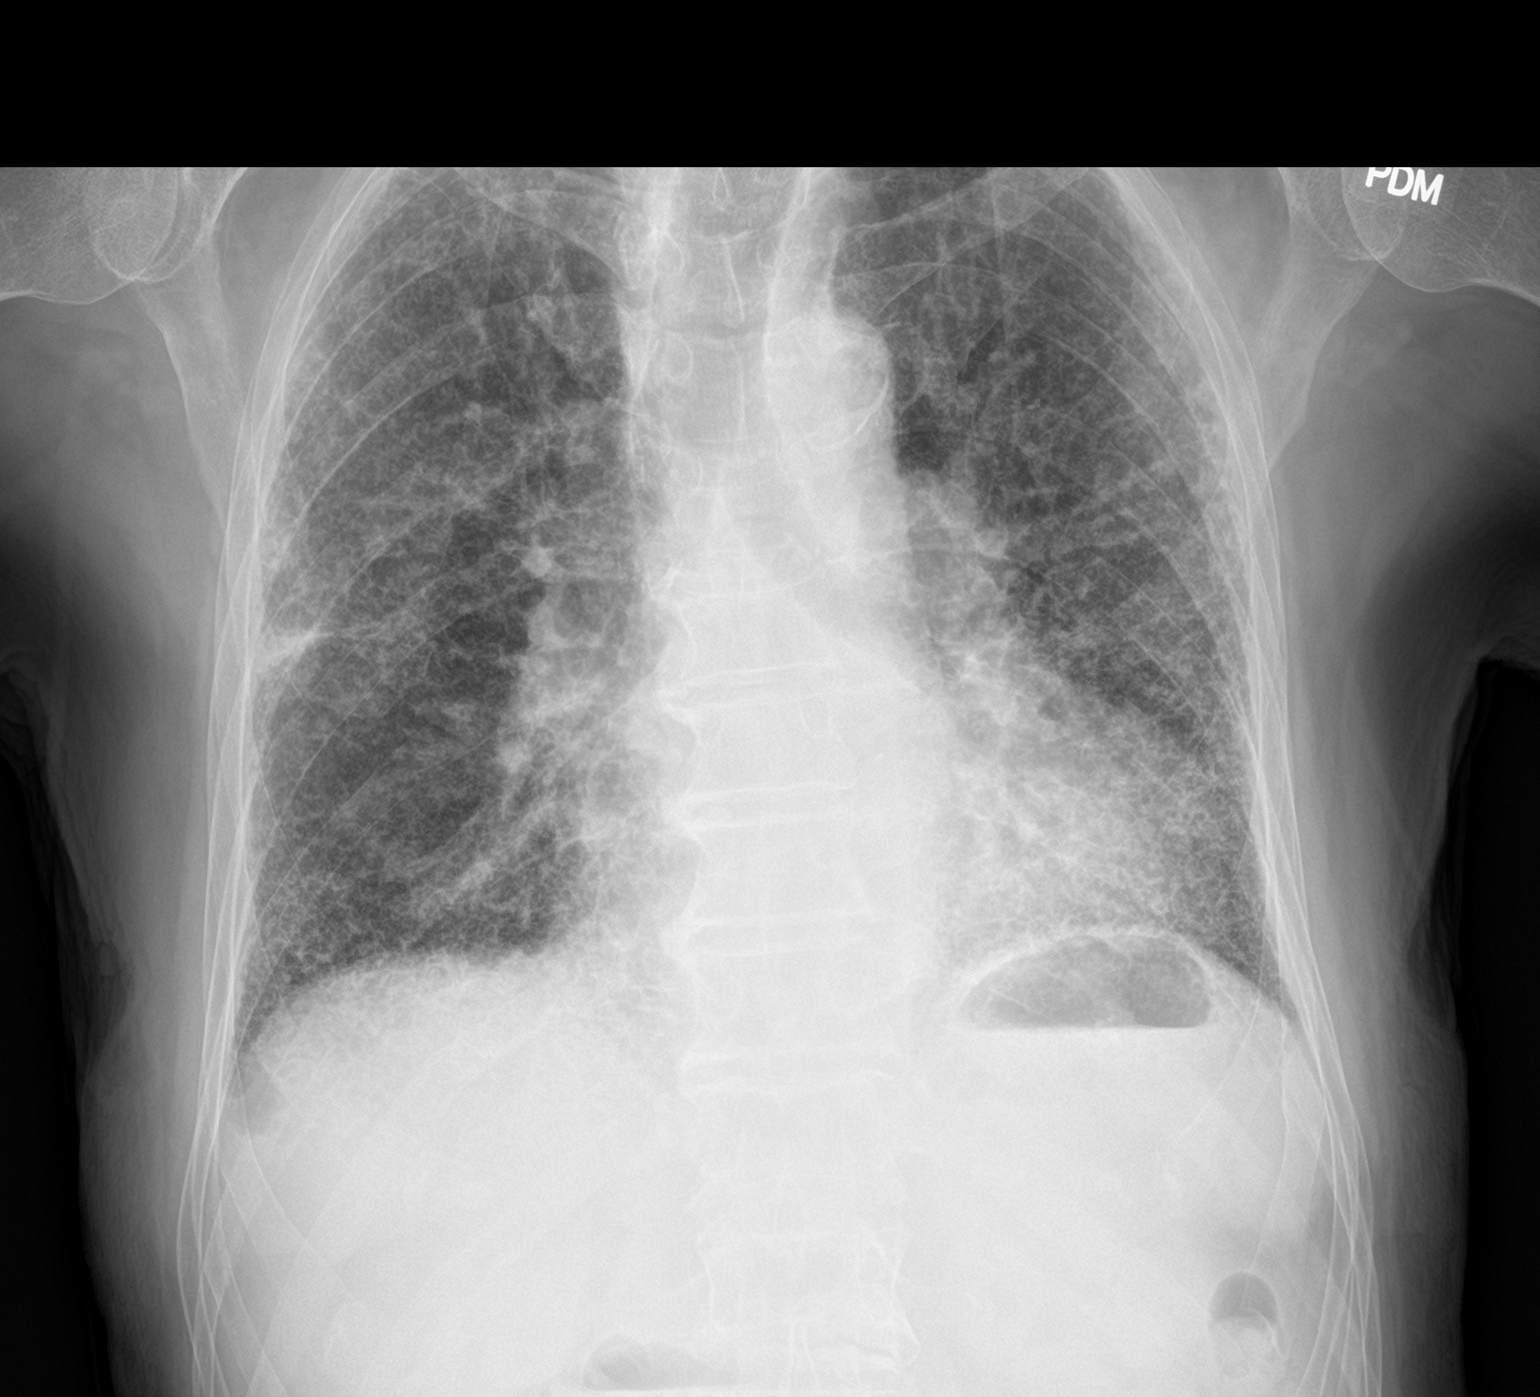

[chest lat]
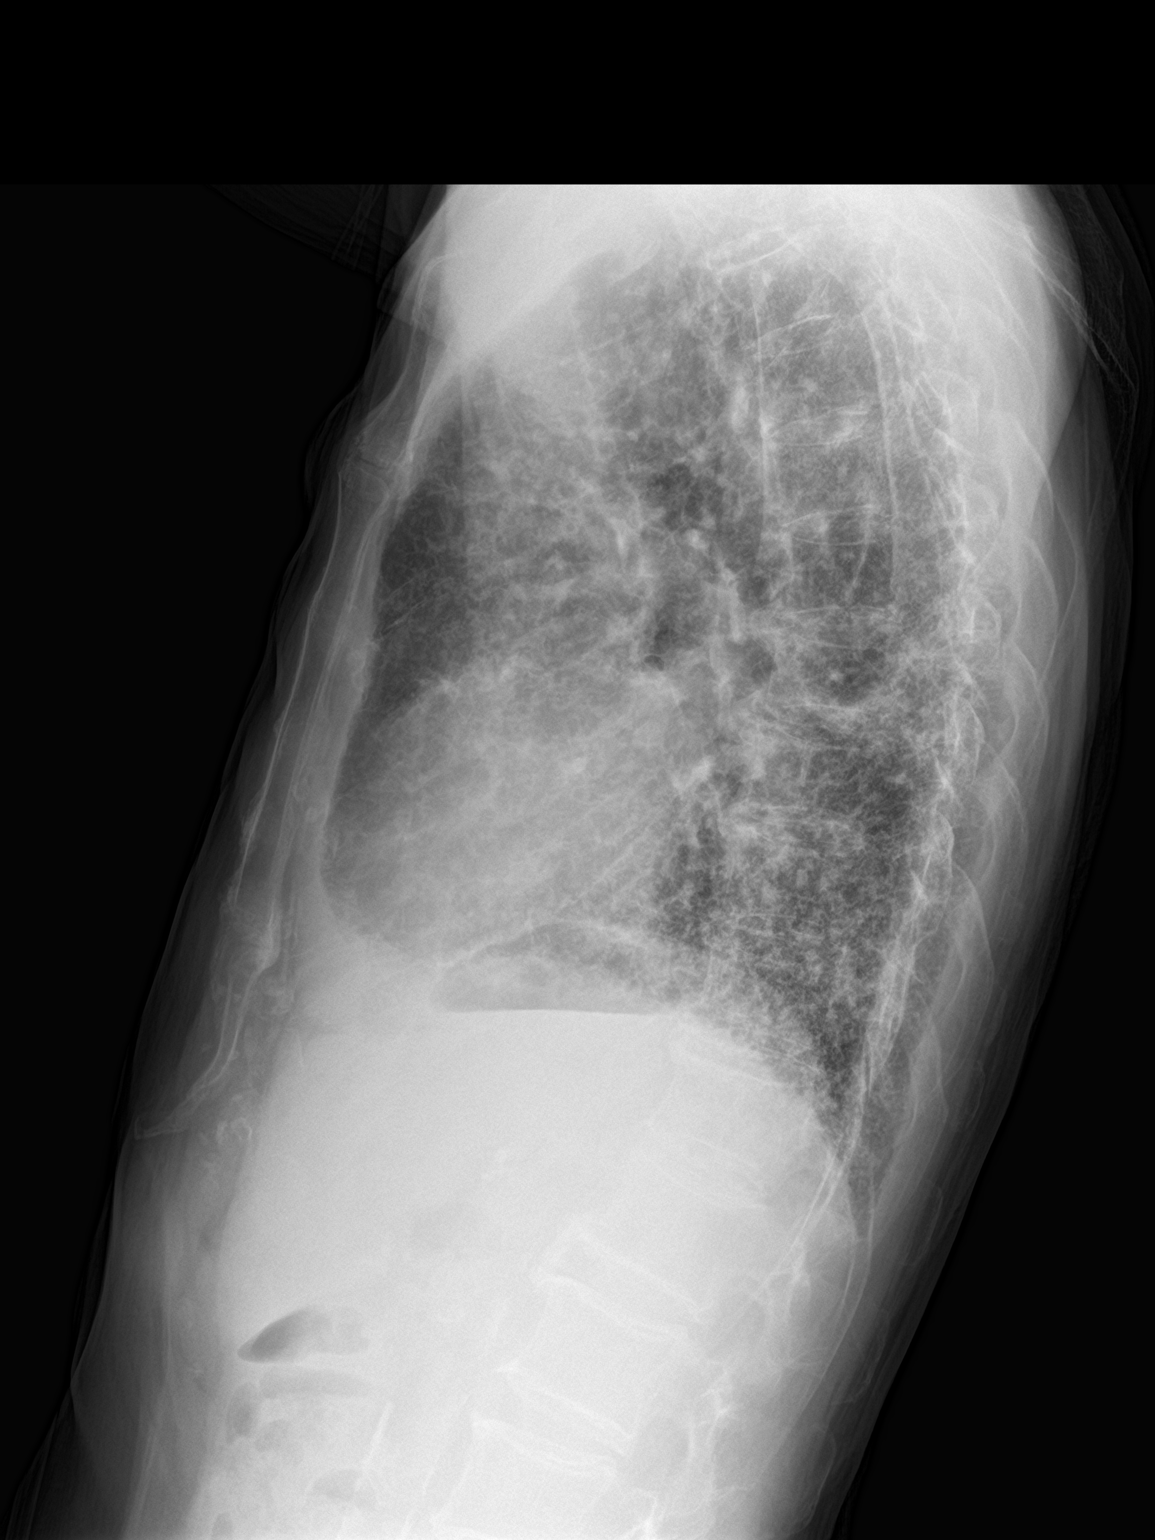

[2 of 2 positions shown; findings below may reference images not displayed]

FINDINGS: Enlargement of cardiac silhouette.

Atherosclerotic calcification aorta.

Mediastinal contours normal.

Slight prominent central pulmonary arteries.

Chronic interstitial infiltrates throughout both lungs consistent
with pulmonary fibrosis and usual interstitial pneumonitis.

No superimposed acute infiltrate, pleural effusion or pneumothorax.

No discrete pulmonary mass/nodule identified.

Bones demineralized.
IMPRESSION: Enlargement of cardiac silhouette with question pulmonary arterial
hypertension.

Severe pulmonary fibrosis/usual interstitial pneumonitis.

No acute abnormalities.
